# Patient Record
Sex: Male | Born: 1985 | Race: White | Hispanic: No | Marital: Single | Smoking: Current every day smoker
Health system: Southern US, Community
[De-identification: ages and names within clinical notes are randomized; demographics above are authoritative.]

## PROBLEM LIST (undated history)

## (undated) DIAGNOSIS — J45909 Unspecified asthma, uncomplicated: Secondary | ICD-10-CM

## (undated) DIAGNOSIS — R569 Unspecified convulsions: Secondary | ICD-10-CM

## (undated) DIAGNOSIS — F32A Depression, unspecified: Secondary | ICD-10-CM

## (undated) DIAGNOSIS — F419 Anxiety disorder, unspecified: Secondary | ICD-10-CM

## (undated) DIAGNOSIS — F329 Major depressive disorder, single episode, unspecified: Secondary | ICD-10-CM

## (undated) HISTORY — PX: APPENDECTOMY: SHX54

---
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
## 2006-01-23 ENCOUNTER — Emergency Department (HOSPITAL_COMMUNITY): Admission: EM | Admit: 2006-01-23 | Discharge: 2006-01-23 | Payer: Self-pay | Admitting: Emergency Medicine

## 2007-01-20 ENCOUNTER — Emergency Department (HOSPITAL_COMMUNITY): Admission: EM | Admit: 2007-01-20 | Discharge: 2007-01-20 | Payer: Self-pay | Admitting: Emergency Medicine

## 2007-01-21 ENCOUNTER — Emergency Department (HOSPITAL_COMMUNITY): Admission: EM | Admit: 2007-01-21 | Discharge: 2007-01-21 | Payer: Self-pay | Admitting: Emergency Medicine

## 2007-01-23 ENCOUNTER — Emergency Department (HOSPITAL_COMMUNITY): Admission: EM | Admit: 2007-01-23 | Discharge: 2007-01-24 | Payer: Self-pay | Admitting: Emergency Medicine

## 2007-03-30 ENCOUNTER — Emergency Department (HOSPITAL_COMMUNITY): Admission: EM | Admit: 2007-03-30 | Discharge: 2007-03-30 | Payer: Self-pay | Admitting: Emergency Medicine

## 2007-04-17 ENCOUNTER — Emergency Department (HOSPITAL_COMMUNITY): Admission: EM | Admit: 2007-04-17 | Discharge: 2007-04-17 | Payer: Self-pay | Admitting: Emergency Medicine

## 2007-04-25 ENCOUNTER — Emergency Department (HOSPITAL_COMMUNITY): Admission: EM | Admit: 2007-04-25 | Discharge: 2007-04-25 | Payer: Self-pay | Admitting: Emergency Medicine

## 2007-05-04 ENCOUNTER — Emergency Department (HOSPITAL_COMMUNITY): Admission: EM | Admit: 2007-05-04 | Discharge: 2007-05-04 | Payer: Self-pay | Admitting: *Deleted

## 2007-05-08 ENCOUNTER — Emergency Department (HOSPITAL_COMMUNITY): Admission: EM | Admit: 2007-05-08 | Discharge: 2007-05-08 | Payer: Self-pay | Admitting: Emergency Medicine

## 2007-06-29 ENCOUNTER — Emergency Department (HOSPITAL_COMMUNITY): Admission: EM | Admit: 2007-06-29 | Discharge: 2007-06-30 | Payer: Self-pay | Admitting: Emergency Medicine

## 2007-10-23 ENCOUNTER — Encounter (INDEPENDENT_AMBULATORY_CARE_PROVIDER_SITE_OTHER): Payer: Self-pay | Admitting: General Surgery

## 2007-10-23 ENCOUNTER — Ambulatory Visit (HOSPITAL_COMMUNITY): Admission: EM | Admit: 2007-10-23 | Discharge: 2007-10-24 | Payer: Self-pay | Admitting: Emergency Medicine

## 2008-07-29 ENCOUNTER — Emergency Department (HOSPITAL_COMMUNITY): Admission: EM | Admit: 2008-07-29 | Discharge: 2008-07-29 | Payer: Self-pay | Admitting: Emergency Medicine

## 2010-03-29 ENCOUNTER — Emergency Department (HOSPITAL_COMMUNITY): Admission: EM | Admit: 2010-03-29 | Discharge: 2010-03-30 | Payer: Self-pay | Admitting: Emergency Medicine

## 2011-04-05 NOTE — H&P (Signed)
NAME:  Zachary Morales, Zachary Morales NO.:  [id number]   MEDICAL RECORD NO.:  [id number]          PATIENT TYPE:  INP   LOCATION:  0098                         FACILITY:  [facility]   PHYSICIAN:  Zachary Morales, M.D.DATE OF BIRTH:  August 11, 1986   DATE OF ADMISSION:  10/23/2007  DATE OF DISCHARGE:                              HISTORY & PHYSICAL   CHIEF COMPLAINT:  Abdominal pain, approximately 8 to 10 hours duration.   HISTORY:  Zachary Morales is a 25 year old Caucasian male who presented to  the emergency room in the early a.m. after having the onset of pretty  quick abdominal pain last evening.  He stated that he had been well  yesterday, he works as a Paediatric nurse, and then after supper started having  pain that pretty quickly change from the upper abdomen to the right  lower quadrant.  He was nauseated and came to the ER approximately  midnight or approximately 2 a.m.  He states he had a bowel movement  previously yesterday that was unremarkable and no difficulty with  urination.  He was seen by the ER physician, Dr. Adriana Morales who found a white  count of 17,500.  He was definitely tender in the right lower quadrant  and a CT with oral and IV contrast was performed and this was read  approximately 0615 that showed acute appendicitis.  I was contacted and  fortunately there was OR available and will proceed on with an  appendectomy promptly.   PAST HISTORY:  He has had no previous surgeries.   SOCIAL HISTORY:  He is not married.  He works as a Paediatric nurse.   MEDICATIONS:  He is not on any type of chronic medications.  He had  received some Zofran in the emergency room.  He is on albuterol for a  breathing issue.   FAMILY HISTORY:  Noncontributory.   PHYSICAL EXAM:  VITAL SIGNS:  His temperature was 97.8, blood pressure  is 129/73, pulse originally was 114 and was 96 after pain medication,  respirations 16.  EYES, EARS, NOSE AND THROAT:  Unremarkable.  LUNGS:  Lungs are clear.  CARDIAC:   Normal sinus rhythm.  ABDOMEN:  He is definitely tender in the right lower quadrant.  He is  not tender in the upper abdomen.  RECTAL:  I did not do a rectal examination.  EXTREMITIES:  Unremarkable.  CNS: Physiologic.   CT was reviewed and showed an inflamed appendix in the right lower  quadrant with a little periappendiceal streaking.  We will plan on  proceeding on with a laparoscopic appendectomy and 3 grams of Unasyn is  ordered.          ______________________________  Zachary Pancoast. Zachary Morales, M.D.    WJW/MEDQ  D:  10/23/2007  T:  10/23/2007  Job:  [id number]

## 2011-04-05 NOTE — Op Note (Signed)
NAME:  Zachary Morales, Zachary Morales NO.:  [id number]   MEDICAL RECORD NO.:  [id number]          PATIENT TYPE:  INP   LOCATION:  0098                         FACILITY:  [facility]   PHYSICIAN:  Anselm Pancoast. Weatherly, M.D.DATE OF BIRTH:  [birth year]   DATE OF PROCEDURE:  DATE OF DISCHARGE:                               OPERATIVE REPORT   PREOPERATIVE DIAGNOSES:  Acute appendicitis.   POSTOPERATIVE DIAGNOSIS:  Acute appendicitis.   OPERATIONS:  Laparoscopic appendectomy.   ANESTHESIA:  General.   SURGEON:  Anselm Pancoast. [name], M.D.   ASSISTANT:  Scrub nurse.   HISTORY:  Elton Runkles is a 25 year old male who was fine yesterday, but  then yesterday evening started having abdominal pain that pretty quickly  changed to the right lower quadrant.  He went to bed, but he was having  increasing pain.  He presented to the emergency room at approximately  2:30.  White count 17,500.  A CT was performed which showed acutely  inflamed appendix in the right lower quadrant.  The patient agreed to a  laparoscopic appendectomy.  Unfortunately there was OR space available  at 7 o'clock, so we could proceed on before the OR schedule.   DESCRIPTION OF PROCEDURE:  The patient was induced general anesthesia.  He had received his Unasyn and positioned on the OR table.  An  endotracheal tube and oral tube to the stomach by Dr. Shireen Quan, and then  the abdomen had been clipped around the umbilicus with a 10-11 port in  the left lower quadrant and prepped with Betadine solution.  A Foley  catheter had been inserted sterilely.  A small incision was made below  the umbilicus __________ quite muscular.  The fascia was identified,  picked up between two Kochers and then very carefully opened through the  fascia into the preperitoneal space.  The underlying peritoneum could be  identified, and I carefully went through this with a Tresa Endo.  A  pursestring suture of 0 Vicryl was placed.  This Hassan cannula  introduced and then a camera introduced.  You could see there was an  inflammatory process in the right lower quadrant.  A 5 mm port was  placed to the right upper quadrant and then the 10-11 was placed in the  left lower quadrant under direct vision.  With the patient in a pretty  steep Trendelenburg rotated to the left, I could slide the omentum up  and then could visualize the appendix, which I grasped it with a million  dollar grasper.  We then tried to decide whether it would be best with a  harmonic scalpel through the 5 mm or possibly umbilicus, but I elected  to hold the appendix up with a 5 mm port and then divided the mesentery  with the Monarch scalpel right to the base of the appendix.  I then used  the GIA stapler laparoscopically with the vascular staples through the  umbilical port, placing it across the base of the appendix.  The cecum  was in good position.  I fired it and there was good hemostasis.  The  appendix was then placed in an EndoCatch bag and then brought out and  the [facility] cannula reinserted.  We inspected, did a little irrigating.  There was no evidence any bleeding.  The 10-11 port under direct vision  was removed, and then the upper 5 mm port was removed under direct  vision.  I then released the carbon dioxide and __________ the fascia  with Kochers.  I put additional figure-of-eight of 0 Vicryl and tied  both it and the pursestring.  I had anesthetized the fascia at the  umbilicus and at the 10-11 in the left lower quadrant with Marcaine.  I  then put a figure-of-eight in the anterior fascia of the left lower  quadrant.  The subcutaneous wounds were closed with 4-0 Vicryl.  Benzoin  and Steri-Strips on the skin.  The patient tolerated the procedure  nicely.  He possibly will be able to go home this evening.  We will see  how he is doing today before making that decision.  I hope they can  start on a liquid at lunchtime and use Vicodin for pain.  I am going  to  give him one additional dose of antibiotics, but the appendix was not  ruptured and I do not think any spillage at the appendectomy.           ______________________________  Anselm Pancoast. [name], M.D.     WJW/MEDQ  D:  10/23/2007  T:  10/23/2007  Job:  [id number]

## 2011-08-29 LAB — COMPREHENSIVE METABOLIC PANEL
AST: 33
Albumin: 4.3
Alkaline Phosphatase: 77
GFR calc Af Amer: >60

## 2011-08-29 LAB — CBC
HCT: 39.3
Hemoglobin: 14
MCHC: 35.5
RBC: 4.43
RDW: 12.7

## 2011-08-29 LAB — DIFFERENTIAL
Basophils Absolute: 0
Eosinophils Relative: 1
Lymphs Abs: 1
Monocytes Absolute: 1.1 — ABNORMAL HIGH
Neutrophils Relative %: 87 — ABNORMAL HIGH

## 2011-08-29 LAB — LIPASE, BLOOD: Lipase: 23

## 2011-08-29 LAB — URINALYSIS, ROUTINE W REFLEX MICROSCOPIC
Bilirubin Urine: NEGATIVE
Ketones, ur: NEGATIVE
Nitrite: NEGATIVE
Urobilinogen, UA: 0.2
pH: 6.5

## 2012-11-28 ENCOUNTER — Emergency Department (HOSPITAL_COMMUNITY): Payer: Self-pay

## 2012-11-28 ENCOUNTER — Encounter (HOSPITAL_COMMUNITY): Payer: Self-pay | Admitting: Emergency Medicine

## 2012-11-28 ENCOUNTER — Emergency Department (HOSPITAL_COMMUNITY)
Admission: EM | Admit: 2012-11-28 | Discharge: 2012-11-28 | Disposition: A | Discharge status: Home / Self Care | Payer: Self-pay | Attending: Emergency Medicine | Admitting: Emergency Medicine

## 2012-11-28 DIAGNOSIS — J0140 Acute pansinusitis, unspecified: Secondary | ICD-10-CM

## 2012-11-28 DIAGNOSIS — H53149 Visual discomfort, unspecified: Secondary | ICD-10-CM | POA: Insufficient documentation

## 2012-11-28 DIAGNOSIS — R112 Nausea with vomiting, unspecified: Secondary | ICD-10-CM | POA: Insufficient documentation

## 2012-11-28 DIAGNOSIS — J45909 Unspecified asthma, uncomplicated: Secondary | ICD-10-CM | POA: Insufficient documentation

## 2012-11-28 DIAGNOSIS — G43909 Migraine, unspecified, not intractable, without status migrainosus: Secondary | ICD-10-CM | POA: Insufficient documentation

## 2012-11-28 DIAGNOSIS — Z79899 Other long term (current) drug therapy: Secondary | ICD-10-CM | POA: Insufficient documentation

## 2012-11-28 DIAGNOSIS — J018 Other acute sinusitis: Secondary | ICD-10-CM | POA: Insufficient documentation

## 2012-11-28 HISTORY — DX: Unspecified asthma, uncomplicated: J45.909

## 2012-11-28 MED ORDER — KETOROLAC TROMETHAMINE 30 MG/ML IJ SOLN
30.0000 mg | Freq: Once | INTRAMUSCULAR | Status: AC
  Administered 2012-11-28: 30 mg via INTRAVENOUS
  Filled 2012-11-28: qty 1

## 2012-11-28 MED ORDER — ONDANSETRON HCL 4 MG/2ML IJ SOLN
4.0000 mg | Freq: Once | INTRAMUSCULAR | Status: AC
  Administered 2012-11-28: 4 mg via INTRAVENOUS
  Filled 2012-11-28: qty 2

## 2012-11-28 MED ORDER — HYDROCODONE-ACETAMINOPHEN 5-325 MG PO TABS: 2.0000 | ORAL_TABLET | ORAL | Status: DC | PRN

## 2012-11-28 MED ORDER — ALBUTEROL SULFATE HFA 108 (90 BASE) MCG/ACT IN AERS
2.0000 | INHALATION_SPRAY | RESPIRATORY_TRACT | Status: DC | PRN
  Administered 2012-11-28: 2 via RESPIRATORY_TRACT
  Filled 2012-11-28: qty 6.7

## 2012-11-28 MED ORDER — DOXYCYCLINE HYCLATE 100 MG PO CAPS: 100.0000 mg | ORAL_CAPSULE | Freq: Two times a day (BID) | ORAL | Status: DC

## 2012-11-28 MED ORDER — DEXTROSE 5 % IV SOLN
1.0000 g | Freq: Once | INTRAVENOUS | Status: AC
  Administered 2012-11-28: 1 g via INTRAVENOUS
  Filled 2012-11-28: qty 10

## 2012-11-28 MED ORDER — SUMATRIPTAN SUCCINATE 6 MG/0.5ML ~~LOC~~ SOLN
6.0000 mg | Freq: Once | SUBCUTANEOUS | Status: AC
  Administered 2012-11-28: 6 mg via SUBCUTANEOUS
  Filled 2012-11-28: qty 0.5

## 2012-11-28 MED ORDER — SODIUM CHLORIDE 0.9 % IV BOLUS (SEPSIS)
1000.0000 mL | Freq: Once | INTRAVENOUS | Status: AC
  Administered 2012-11-28: 1000 mL via INTRAVENOUS

## 2012-11-28 MED ORDER — PSEUDOEPHEDRINE HCL 30 MG/5ML PO SYRP: 30.0000 mg | ORAL_SOLUTION | Freq: Four times a day (QID) | ORAL | Status: DC | PRN

## 2012-11-28 MED ORDER — DIPHENHYDRAMINE HCL 50 MG/ML IJ SOLN
25.0000 mg | Freq: Once | INTRAMUSCULAR | Status: AC
  Administered 2012-11-28: 25 mg via INTRAVENOUS
  Filled 2012-11-28: qty 1

## 2012-11-28 MED ORDER — AMOXICILLIN-POT CLAVULANATE 500-125 MG PO TABS: 1.0000 | ORAL_TABLET | Freq: Three times a day (TID) | ORAL | Status: DC

## 2012-11-28 MED ORDER — FENTANYL CITRATE 0.05 MG/ML IJ SOLN
INTRAMUSCULAR | Status: AC
  Administered 2012-11-28: 50 ug
  Filled 2012-11-28: qty 2

## 2012-11-28 MED ORDER — FENTANYL CITRATE 0.05 MG/ML IJ SOLN
100.0000 ug | Freq: Once | INTRAMUSCULAR | Status: AC
  Administered 2012-11-28: 50 ug via INTRAVENOUS
  Filled 2012-11-28: qty 2

## 2012-11-28 NOTE — ED Notes (Signed)
Family gave pt 2 puffs of albuterol from home inhaler.

## 2012-11-28 NOTE — ED Notes (Signed)
Pt given crackers and ginger ale.  Pt states he feels better.  No sob

## 2012-11-28 NOTE — ED Provider Notes (Signed)
History     CSN: [id number]  Arrival date & time 11/28/12  4098   First MD Initiated Contact with Patient 11/28/12 0740     Chief Complaint  Patient presents with  . Cough  . Headache  . Influenza     HPI  Patient has had productive cough for the last week and then this morning woke up with a migraine headache with photophobia nausea and vomiting.  Patient has history of migraines in the past and states that this feels more like a migraine and not a typical headache.  Patient denies stiff neck.  Has history of asthma uses albuterol on a when necessary basis.  He last used on the raw last night. Past Medical History  Diagnosis Date  . Asthma     Past Surgical History  Procedure Date  . Appendectomy     No family history on file.  History  Substance Use Topics  . Smoking status: Never Smoker   . Smokeless tobacco: Not on file  . Alcohol Use: No     Comment: occassional      Review of Systems All other systems reviewed and are negative Allergies  Review of patient's allergies indicates no known allergies.  Home Medications   Current Outpatient Rx  Name  Route  Sig  Dispense  Refill  . ALBUTEROL SULFATE HFA 108 (90 BASE) MCG/ACT IN AERS   Inhalation   Inhale 2 puffs into the lungs every 6 (six) hours as needed. Wheezing         . AMOXICILLIN-POT CLAVULANATE 500-125 MG PO TABS   Oral   Take 1 tablet (500 mg total) by mouth 3 (three) times daily.   30 tablet   0   . HYDROCODONE-ACETAMINOPHEN 5-325 MG PO TABS   Oral   Take 2 tablets by mouth every 4 (four) hours as needed for pain.   10 tablet   0   . PSEUDOEPHEDRINE HCL 30 MG/5ML PO SYRP   Oral   Take 5 mLs (30 mg total) by mouth 4 (four) times daily as needed.   118 mL   0     BP 145/96  Pulse 119  Temp 97.7 F (36.5 C) (Oral)  Resp 16  Ht 5\' 8"  (1.727 m)  Wt 160 lb (72.576 kg)  BMI 24.33 kg/m2  SpO2 99%  Physical Exam  Nursing note and vitals reviewed. Constitutional: He is oriented to  person, place, and time. He appears well-developed and well-nourished. He appears distressed (Patient has photophobia and appears uncomfortable from his headache).  HENT:  Head: Normocephalic and atraumatic.         Tender to percussion where indicated  Eyes: Pupils are equal, round, and reactive to light.  Neck: Normal range of motion.  Cardiovascular: Normal rate and intact distal pulses.   Pulmonary/Chest: No respiratory distress.  Abdominal: Normal appearance. He exhibits no distension.  Musculoskeletal: Normal range of motion.  Neurological: He is alert and oriented to person, place, and time. No cranial nerve deficit. GCS eye subscore is 4. GCS verbal subscore is 5. GCS motor subscore is 6.  Skin: Skin is warm and dry. No rash noted.  Psychiatric: He has a normal mood and affect. His behavior is normal.    ED Course  Procedures (including critical care time)  Medications  albuterol (PROVENTIL HFA;VENTOLIN HFA) 108 (90 BASE) MCG/ACT inhaler (not administered)  pseudoephedrine (SUDAFED) 30 MG/5ML syrup (not administered)  amoxicillin-clavulanate (AUGMENTIN) 500-125 MG per tablet (not administered)  HYDROcodone-acetaminophen (  NORCO/VICODIN) 5-325 MG per tablet (not administered)  doxycycline (VIBRAMYCIN) 100 MG capsule (not administered)  albuterol (PROVENTIL HFA;VENTOLIN HFA) 108 (90 BASE) MCG/ACT inhaler 2 puff (2 puff Inhalation Given 11/28/12 1028)  sodium chloride 0.9 % bolus 1,000 mL (0 mL Intravenous Stopped 11/28/12 0917)  ketorolac (TORADOL) 30 MG/ML injection 30 mg (30 mg Intravenous Given 11/28/12 0812)  ondansetron (ZOFRAN) injection 4 mg (4 mg Intravenous Given 11/28/12 0812)  fentaNYL (SUBLIMAZE) injection 100 mcg (50 mcg Intravenous Given 11/28/12 0812)  SUMAtriptan (IMITREX) injection 6 mg (6 mg Subcutaneous Given 11/28/12 0813)  fentaNYL (SUBLIMAZE) 0.05 MG/ML injection (50 mcg  Given 11/28/12 0823)  cefTRIAXone (ROCEPHIN) 1 g in dextrose 5 % 50 mL IVPB (0 g Intravenous Stopped  11/28/12 1008)  diphenhydrAMINE (BENADRYL) injection 25 mg (25 mg Intravenous Given 11/28/12 0916)    Labs Reviewed - No data to display Dg Chest 2 View  11/28/2012  *RADIOLOGY REPORT*  Clinical Data: Fever, cough.  CHEST - 2 VIEW  Comparison: Mar 29, 2010.  Findings: Cardiomediastinal silhouette appears normal.  No acute pulmonary disease is noted.  Bony thorax is intact.  IMPRESSION: No acute cardiopulmonary abnormality seen.   Original Report Authenticated By: Lupita Raider.,  M.D.    Ct Head Wo Contrast  11/28/2012  *RADIOLOGY REPORT*  Clinical Data:  Migraine headaches, photophobia, nausea vomiting, cough, congestion  CT HEAD WITHOUT CONTRAST  Technique:  Contiguous axial images were obtained from the base of the skull through the vertex without contrast  Comparison:  03/29/2010  Findings:  The brain has a normal appearance without evidence for hemorrhage, acute infarction, hydrocephalus, or mass lesion.  There is no extra axial fluid collection.   The mastoids are clear. Sphenoid, ethmoid, and maxillary sinuses demonstrate mucosal thickening bilaterally.  Right maxillary sinus air fluid level noted.  Findings compatible with pansinusitis.  IMPRESSION: Normal CT of the head without contrast.  Pan sinusitis as above.   Original Report Authenticated By: Judie Petit. Shick, M.D.      1. Acute pansinusitis       MDM  After treatment in the ED the patient feels back to baseline and wants to go home.        Nelia Shi, MD 11/28/12 1106

## 2012-11-28 NOTE — ED Notes (Signed)
States that he has been sick for 1 week. States that he has chills, cough, congestion, and decreased appetite. Productive cough with green sputum

## 2013-03-01 ENCOUNTER — Encounter (HOSPITAL_COMMUNITY): Payer: Self-pay | Admitting: Emergency Medicine

## 2013-03-01 ENCOUNTER — Emergency Department (HOSPITAL_COMMUNITY)
Admission: EM | Admit: 2013-03-01 | Discharge: 2013-03-02 | Disposition: A | Discharge status: Home / Self Care | Payer: Self-pay | Attending: Emergency Medicine | Admitting: Emergency Medicine

## 2013-03-01 DIAGNOSIS — Z79899 Other long term (current) drug therapy: Secondary | ICD-10-CM | POA: Insufficient documentation

## 2013-03-01 DIAGNOSIS — J45909 Unspecified asthma, uncomplicated: Secondary | ICD-10-CM | POA: Insufficient documentation

## 2013-03-01 DIAGNOSIS — E876 Hypokalemia: Secondary | ICD-10-CM | POA: Insufficient documentation

## 2013-03-01 DIAGNOSIS — R45851 Suicidal ideations: Secondary | ICD-10-CM | POA: Insufficient documentation

## 2013-03-01 DIAGNOSIS — F329 Major depressive disorder, single episode, unspecified: Secondary | ICD-10-CM | POA: Insufficient documentation

## 2013-03-01 DIAGNOSIS — F410 Panic disorder [episodic paroxysmal anxiety] without agoraphobia: Secondary | ICD-10-CM | POA: Insufficient documentation

## 2013-03-01 DIAGNOSIS — F4320 Adjustment disorder, unspecified: Secondary | ICD-10-CM | POA: Insufficient documentation

## 2013-03-01 DIAGNOSIS — F3289 Other specified depressive episodes: Secondary | ICD-10-CM | POA: Insufficient documentation

## 2013-03-01 MED ORDER — SODIUM CHLORIDE 0.9 % IV SOLN
INTRAVENOUS | Status: DC
  Administered 2013-03-02: via INTRAVENOUS

## 2013-03-01 MED ORDER — LORAZEPAM 2 MG/ML IJ SOLN
2.0000 mg | Freq: Once | INTRAMUSCULAR | Status: AC
  Administered 2013-03-02: 2 mg via INTRAVENOUS
  Filled 2013-03-01: qty 1

## 2013-03-01 NOTE — ED Notes (Signed)
Pt states he has been depressed x 1 week, +SI, pt states there is an issue with his wife making him feel this way.

## 2013-03-01 NOTE — ED Provider Notes (Signed)
History     CSN: [id number]  Arrival date & time 03/01/13  2346   First MD Initiated Contact with Patient 03/01/13 2355      Chief Complaint  Patient presents with  . Chest Pain    (Consider location/radiation/quality/duration/timing/severity/associated sxs/prior treatment) HPI Level 5 Caveat: altered mental status. This is a 27 year old male who was found on the ground thrashing about by a neighbor who is well familiar with the patient. The neighbor drove the patient to the ED. He states the patient had several "seizures" all the way during which she was thrashing about and screaming. On arrival he is thrashing about and screaming stating that his chest hurts, it is burning, he hurts all over. He is screaming obscenities and being uncooperative. He initially denied drug use on the grounds that he is Jewish but subsequently admitted to smoking marijuana; when asked if if the marijuana may have been treated with some other drugs he stated "I rolled it myself".  On further questioning the patient admits to being depressed and suicidal the past week after his wife left him and took their children.  Past Medical History  Diagnosis Date  . Asthma     Past Surgical History  Procedure Laterality Date  . Appendectomy      No family history on file.  History  Substance Use Topics  . Smoking status: Never Smoker   . Smokeless tobacco: Not on file  . Alcohol Use: Yes     Comment: occassional      Review of Systems  Unable to perform ROS   Allergies  Review of patient's allergies indicates no known allergies.  Home Medications   Current Outpatient Rx  Name  Route  Sig  Dispense  Refill  . albuterol (PROVENTIL HFA;VENTOLIN HFA) 108 (90 BASE) MCG/ACT inhaler   Inhalation   Inhale 2 puffs into the lungs every 6 (six) hours as needed. Wheezing         . diazepam (VALIUM) 2 MG tablet   Oral   Take 0.5-1 mg by mouth once.           BP 159/79  Pulse 120  Temp(Src)  98.3 F (36.8 C)  Resp 30  Ht 5\' 7"  (1.702 m)  Wt 165 lb (74.844 kg)  BMI 25.84 kg/m2  SpO2 98%  Physical Exam General: Well-developed, well-nourished male in obvious emotional distress, thrashing and yelling obscenities; appearance consistent with age of record HENT: normocephalic, atraumatic Eyes: pupils equal round and reactive to light; extraocular muscles intact Neck: supple Heart: regular rate and rhythm; tachycardia Lungs: clear to auscultation bilaterally; tachypnea Abdomen: soft; nondistended; nontender; no masses or hepatosplenomegaly; bowel sounds present Extremities: No deformity; full range of motion; pulses normal Neurologic: Awake, alert; motor function intact in all extremities and symmetric; no facial droop Skin: Warm and dry Psychiatric: Agitated; angry affect; depressed; suicidal ideation; denies homicidal ideation    ED Course  Procedures (including critical care time)     MDM   Nursing notes and vitals signs, including pulse oximetry, reviewed.  Summary of this visit's results, reviewed by myself:  Labs:  Results for orders placed during the hospital encounter of 03/01/13 (from the past 24 hour(s))  CBC WITH DIFFERENTIAL     Status: Abnormal   Collection Time    03/01/13 11:57 PM      Result Value Range   WBC 12.9 (*) 4.0 - 10.5 K/uL   RBC 5.20  4.22 - 5.81 MIL/uL   Hemoglobin 16.1  13.0 - 17.0 g/dL   HCT 69.6  29.5 - 28.4 %   MCV 86.9  78.0 - 100.0 fL   MCH 31.0  26.0 - 34.0 pg   MCHC 35.6  30.0 - 36.0 g/dL   RDW 13.2  44.0 - 10.2 %   Platelets 300  150 - 400 K/uL   Neutrophils Relative 65  43 - 77 %   Neutro Abs 8.4 (*) 1.7 - 7.7 K/uL   Lymphocytes Relative 26  12 - 46 %   Lymphs Abs 3.3  0.7 - 4.0 K/uL   Monocytes Relative 7  3 - 12 %   Monocytes Absolute 1.0  0.1 - 1.0 K/uL   Eosinophils Relative 2  0 - 5 %   Eosinophils Absolute 0.2  0.0 - 0.7 K/uL   Basophils Relative 0  0 - 1 %   Basophils Absolute 0.1  0.0 - 0.1 K/uL  CK TOTAL  AND CKMB     Status: Abnormal   Collection Time    03/01/13 11:57 PM      Result Value Range   Total CK 240 (*) 7 - 232 U/L   CK, MB 2.0  0.3 - 4.0 ng/mL   Relative Index 0.8  0.0 - 2.5  ETHANOL     Status: None   Collection Time    03/01/13 11:57 PM      Result Value Range   Alcohol, Ethyl (B) <11  0 - 11 mg/dL  SALICYLATE LEVEL     Status: Abnormal   Collection Time    03/01/13 11:57 PM      Result Value Range   Salicylate Lvl <2.0 (*) 2.8 - 20.0 mg/dL  ACETAMINOPHEN LEVEL     Status: None   Collection Time    03/01/13 11:57 PM      Result Value Range   Acetaminophen (Tylenol), Serum <15.0  10 - 30 ug/mL  POCT I-STAT TROPONIN I     Status: None   Collection Time    03/02/13 12:00 AM      Result Value Range   Troponin i, poc 0.00  0.00 - 0.08 ng/mL   Comment 3           POCT I-STAT, CHEM 8     Status: Abnormal   Collection Time    03/02/13 12:02 AM      Result Value Range   Sodium 142  135 - 145 mEq/L   Potassium 3.0 (*) 3.5 - 5.1 mEq/L   Chloride 107  96 - 112 mEq/L   BUN 9  6 - 23 mg/dL   Creatinine, Ser 7.25  0.50 - 1.35 mg/dL   Glucose, Bld 366 (*) 70 - 99 mg/dL   Calcium, Ion 4.40  3.47 - 1.23 mmol/L   TCO2 16  0 - 100 mmol/L   Hemoglobin 16.7  13.0 - 17.0 g/dL   HCT 42.5  95.6 - 38.7 %  BLOOD GAS, VENOUS     Status: Abnormal   Collection Time    03/02/13 12:25 AM      Result Value Range   FIO2 0.21     pH, Ven 7.536 (*) 7.250 - 7.300   pCO2, Ven 23.6 (*) 45.0 - 50.0 mmHg   pO2, Ven 86.2 (*) 30.0 - 45.0 mmHg   Bicarbonate 19.9 (*) 20.0 - 24.0 mEq/L   TCO2 16.6  0 - 100 mmol/L   Acid-base deficit 0.5  0.0 - 2.0 mmol/L   O2 Saturation 97.6  Patient temperature 98.6     Drawn by COLLECTED BY LABORATORY     Sample type VENOUS    URINE RAPID DRUG SCREEN (HOSP PERFORMED)     Status: Abnormal   Collection Time    03/02/13  1:18 AM      Result Value Range   Opiates NONE DETECTED  NONE DETECTED   Cocaine NONE DETECTED  NONE DETECTED   Benzodiazepines  POSITIVE (*) NONE DETECTED   Amphetamines NONE DETECTED  NONE DETECTED   Tetrahydrocannabinol POSITIVE (*) NONE DETECTED   Barbiturates NONE DETECTED  NONE DETECTED    EKG Interpretation:  Date & Time: 03/02/2013 12:07 AM  Rate: 143  Rhythm: sinus tachycardia  QRS Axis: right  Intervals: normal  ST/T Wave abnormalities: normal  Conduction Disutrbances:none  Narrative Interpretation:   Old EKG Reviewed: none available  12:28 AM Patient much more calm and apologetic after being given 2 mg of Ativan IV. He states that during the past week he has had to undergo separation from his wife and children and he has had a poor ability to cope with this. He states he has been having "seizures" which he states consist of pain in his chest and rapid breathing. He states he sometimes doesn't remember what happens during these episodes. He has no history of true seizures.   He admits to suicidal thoughts and is considered driving a motorcycle off a cliff but states that he is basically too "chicken" to actually carry it out.  12:55 AM The patient's blood gas is consistent with hyperventilation. His "seizures" are likely panic attacks with syncope as a result of hyperventilation.      Hanley Seamen, MD 03/02/13 [phone]

## 2013-03-01 NOTE — ED Notes (Signed)
Pt states that "my wife has left and taken my baby today."  Pt directed to breath deeply to be able to stop his hyperventilating.  Pt has a family friend at bedside

## 2013-03-01 NOTE — ED Notes (Signed)
Pt brought to ED by friend, pt c/o severe chest pain, pt appears to have total body contractions. Pt yelling out during these episodes.

## 2013-03-02 LAB — BLOOD GAS, VENOUS
Bicarbonate: 19.9 mEq/L — ABNORMAL LOW (ref 20.0–24.0)
O2 Saturation: 97.6 %
Patient temperature: 98.6
TCO2: 16.6 mmol/L (ref 0–100)
pO2, Ven: 86.2 mmHg — ABNORMAL HIGH (ref 30.0–45.0)

## 2013-03-02 LAB — CBC WITH DIFFERENTIAL/PLATELET
Basophils Absolute: 0.1 10*3/uL (ref 0.0–0.1)
Basophils Relative: 0 % (ref 0–1)
Eosinophils Absolute: 0.2 10*3/uL (ref 0.0–0.7)
Eosinophils Relative: 2 % (ref 0–5)
MCH: 31 pg (ref 26.0–34.0)
MCHC: 35.6 g/dL (ref 30.0–36.0)
MCV: 86.9 fL (ref 78.0–100.0)
Monocytes Absolute: 1 10*3/uL (ref 0.1–1.0)
Platelets: 300 10*3/uL (ref 150–400)
RDW: 12.7 % (ref 11.5–15.5)
WBC: 12.9 10*3/uL — ABNORMAL HIGH (ref 4.0–10.5)

## 2013-03-02 LAB — POCT I-STAT, CHEM 8
BUN: 9 mg/dL (ref 6–23)
Creatinine, Ser: 1.2 mg/dL (ref 0.50–1.35)
Glucose, Bld: 130 mg/dL — ABNORMAL HIGH (ref 70–99)
Hemoglobin: 16.7 g/dL (ref 13.0–17.0)
Potassium: 3 mEq/L — ABNORMAL LOW (ref 3.5–5.1)
TCO2: 16 mmol/L (ref 0–100)

## 2013-03-02 LAB — CK TOTAL AND CKMB (NOT AT ARMC): Total CK: 240 U/L — ABNORMAL HIGH (ref 7–232)

## 2013-03-02 LAB — RAPID URINE DRUG SCREEN, HOSP PERFORMED
Amphetamines: NOT DETECTED
Benzodiazepines: POSITIVE — AB
Tetrahydrocannabinol: POSITIVE — AB

## 2013-03-02 LAB — ACETAMINOPHEN LEVEL: Acetaminophen (Tylenol), Serum: <15 ug/mL (ref 10–30)

## 2013-03-02 LAB — SALICYLATE LEVEL: Salicylate Lvl: <2 mg/dL — ABNORMAL LOW (ref 2.8–20.0)

## 2013-03-02 MED ORDER — CLONAZEPAM 0.5 MG PO TABS: 0.5000 mg | ORAL_TABLET | Freq: Every day | ORAL | Status: DC

## 2013-03-02 MED ORDER — ZIPRASIDONE MESYLATE 20 MG IM SOLR
INTRAMUSCULAR | Status: AC
  Administered 2013-03-02: 20 mg
  Filled 2013-03-02: qty 20

## 2013-03-02 MED ORDER — LORAZEPAM 1 MG PO TABS
2.0000 mg | ORAL_TABLET | Freq: Four times a day (QID) | ORAL | Status: DC | PRN
  Administered 2013-03-02: 2 mg via ORAL
  Filled 2013-03-02: qty 2

## 2013-03-02 MED ORDER — ONDANSETRON HCL 4 MG PO TABS: 4.0000 mg | ORAL_TABLET | Freq: Three times a day (TID) | ORAL | Status: DC | PRN

## 2013-03-02 MED ORDER — POTASSIUM CHLORIDE CRYS ER 20 MEQ PO TBCR
20.0000 meq | EXTENDED_RELEASE_TABLET | Freq: Every day | ORAL | Status: DC
  Administered 2013-03-02: 20 meq via ORAL
  Filled 2013-03-02: qty 1

## 2013-03-02 MED ORDER — POTASSIUM CHLORIDE 20 MEQ/15ML (10%) PO LIQD
20.0000 meq | Freq: Once | ORAL | Status: AC
  Administered 2013-03-02: 20 meq via ORAL
  Filled 2013-03-02: qty 15

## 2013-03-02 MED ORDER — ACETAMINOPHEN 325 MG PO TABS: 650.0000 mg | ORAL_TABLET | ORAL | Status: DC | PRN

## 2013-03-02 MED ORDER — ALUM & MAG HYDROXIDE-SIMETH 200-200-20 MG/5ML PO SUSP: 30.0000 mL | ORAL | Status: DC | PRN

## 2013-03-02 NOTE — ED Notes (Signed)
Called SOC to inform patient is awake and ready for consult.

## 2013-03-02 NOTE — ED Notes (Addendum)
Pt transferred from main ED presents with complaint of depression & anxiety after wife & child left him tonight.  C/O depression x 2 wks.  SI with plan to run car off bridge.  Feeling hopeless at present.
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
## 2013-03-02 NOTE — ED Notes (Signed)
At nurses station loud, angry wanting to use the phone-stating that he has been waiting for something for pain. Explained that his previous nurse had spoken with the MD and he was not going to order any narcotic pain med. Offered the tylenol or ativan. Stated he had been waiting for the ativan and no one ever brought it. Told him if he would wait, I would get it for him right now. After I went into the med room, he started arguing with Crawford(security officer). Began to really escalate-stating he would fight Crawford,yelling, cursing. Additional security and [employer] called to assist. They escorted him back to his room. I went in to give him the ativan and he refused(continuing to argue with [employer]). As I was walking back down the hall he said he did want the ativan. Back in his room he did take med-the whole time arguing with [employer]. Very hyperverbal, flight of ideas.

## 2013-03-02 NOTE — ED Notes (Signed)
telepsych consult in progress

## 2013-03-02 NOTE — B[facility] Counselor (Signed)
Telepsych consult attempted by Dr. Jacky Kindle.  Pt given Geodon prior to psychiatrist calling, unable to complete telepsych at this time.  Will try again later today.

## 2013-03-02 NOTE — ED Notes (Signed)
Pt's wife called security asking for information concerning patient.  Pt asked if information could be given, pt states NOT to divulge any information to outside callers.

## 2013-03-02 NOTE — ED Provider Notes (Signed)
4:46 PM pt has had telepsych consult who recommend discharge.  He has rescinded the IVC paperwork.  Recommend klonopin 0.5mg  po qHS x 10 days which I will write.    Ethelda Chick, MD 03/02/13 [phone]

## 2013-03-02 NOTE — ED Notes (Signed)
Awake from Geodon injection. Up to nurses station very apologetic about his behavior this morning. Pleasant, cooperative.

## 2013-04-29 ENCOUNTER — Emergency Department (HOSPITAL_COMMUNITY)
Admission: EM | Admit: 2013-04-29 | Discharge: 2013-04-29 | Disposition: A | Discharge status: Home / Self Care | Payer: Self-pay | Attending: Emergency Medicine | Admitting: Emergency Medicine

## 2013-04-29 ENCOUNTER — Encounter (HOSPITAL_COMMUNITY): Payer: Self-pay

## 2013-04-29 DIAGNOSIS — J45909 Unspecified asthma, uncomplicated: Secondary | ICD-10-CM | POA: Insufficient documentation

## 2013-04-29 DIAGNOSIS — R569 Unspecified convulsions: Secondary | ICD-10-CM | POA: Insufficient documentation

## 2013-04-29 DIAGNOSIS — R51 Headache: Secondary | ICD-10-CM | POA: Insufficient documentation

## 2013-04-29 DIAGNOSIS — Z79899 Other long term (current) drug therapy: Secondary | ICD-10-CM | POA: Insufficient documentation

## 2013-04-29 LAB — COMPREHENSIVE METABOLIC PANEL
ALT: 30 U/L (ref 0–53)
AST: 23 U/L (ref 0–37)
Calcium: 8.6 mg/dL (ref 8.4–10.5)
Creatinine, Ser: 0.88 mg/dL (ref 0.50–1.35)
GFR calc Af Amer: >90 mL/min (ref >90)
Sodium: 139 mEq/L (ref 135–145)
Total Protein: 6.4 g/dL (ref 6.0–8.3)

## 2013-04-29 LAB — ETHANOL: Alcohol, Ethyl (B): <11 mg/dL (ref 0–11)

## 2013-04-29 LAB — RAPID URINE DRUG SCREEN, HOSP PERFORMED
Amphetamines: NOT DETECTED
Barbiturates: NOT DETECTED
Benzodiazepines: POSITIVE — AB
Cocaine: NOT DETECTED

## 2013-04-29 LAB — CBC WITH DIFFERENTIAL/PLATELET
Basophils Absolute: 0 10*3/uL (ref 0.0–0.1)
Eosinophils Absolute: 0.4 10*3/uL (ref 0.0–0.7)
Eosinophils Relative: 5 % (ref 0–5)
MCH: 30.5 pg (ref 26.0–34.0)
MCHC: 35 g/dL (ref 30.0–36.0)
MCV: 87.1 fL (ref 78.0–100.0)
Platelets: 236 10*3/uL (ref 150–400)
RDW: 12.7 % (ref 11.5–15.5)

## 2013-04-29 MED ORDER — LORAZEPAM 2 MG/ML IJ SOLN
1.0000 mg | Freq: Once | INTRAMUSCULAR | Status: AC
  Administered 2013-04-29: 1 mg via INTRAVENOUS
  Filled 2013-04-29: qty 1

## 2013-04-29 MED ORDER — POTASSIUM CHLORIDE CRYS ER 20 MEQ PO TBCR
40.0000 meq | EXTENDED_RELEASE_TABLET | Freq: Once | ORAL | Status: AC
  Administered 2013-04-29: 40 meq via ORAL
  Filled 2013-04-29: qty 2

## 2013-04-29 NOTE — ED Provider Notes (Signed)
History     CSN: [id number]  Arrival date & time 04/29/13  0123   First MD Initiated Contact with Patient 04/29/13 0138      Chief Complaint  Patient presents with  . Seizures   HPI  History provided by the patient and previous medical record. Patient is a 27 year old male with history of asthma who presents with possible seizure by EMS. Patient does not recall exactly what happened. He states he was out to eat does admit to having one beer with dinner and after returning home he remembers everyone standing around him and EMS taking him to the hospital. He states he was told he may have had a seizure. Patient states he has had some seizures in the past recently but has not been evaluated for this because he does not have insurance. He has no other past history of seizures. Patient was seen in the emergency room last April without any reports a past history of seizures. He was seen at that time for psychiatric issues. Patient denies any SI or H. He does report slight headache. He denies any other complaints or associated symptoms. There was no urinary or fecal incontinence. No bite marks to the tongue.     Past Medical History  Diagnosis Date  . Asthma     Past Surgical History  Procedure Laterality Date  . Appendectomy      History reviewed. No pertinent family history.  History  Substance Use Topics  . Smoking status: Never Smoker   . Smokeless tobacco: Not on file  . Alcohol Use: Yes     Comment: occassional      Review of Systems  Respiratory: Negative for shortness of breath.   Cardiovascular: Negative for palpitations.  Neurological: Positive for seizures and headaches. Negative for weakness and numbness.  All other systems reviewed and are negative.    Allergies  Review of patient's allergies indicates no known allergies.  Home Medications   Current Outpatient Rx  Name  Route  Sig  Dispense  Refill  . albuterol (PROVENTIL HFA;VENTOLIN HFA) 108 (90 BASE)  MCG/ACT inhaler   Inhalation   Inhale 2 puffs into the lungs every 6 (six) hours as needed. Wheezing         . clonazePAM (KLONOPIN) 0.5 MG tablet   Oral   Take 1 tablet (0.5 mg total) by mouth at bedtime.   10 tablet   0   . diazepam (VALIUM) 2 MG tablet   Oral   Take 0.5-1 mg by mouth once.           BP 120/73  Pulse 85  Temp(Src) 97.7 F (36.5 C) (Oral)  Resp 17  SpO2 90%  Physical Exam  Nursing note and vitals reviewed. Constitutional: He is oriented to person, place, and time. He appears well-developed and well-nourished.  HENT:  Head: Normocephalic and atraumatic.  Mouth/Throat: Oropharynx is clear and moist.  No bite marks to the tongue. Normal dentition  Cardiovascular: Normal rate and regular rhythm.   No murmur heard. Pulmonary/Chest: Effort normal and breath sounds normal. No respiratory distress. He has no wheezes. He has no rales.  Abdominal: Soft. There is no tenderness. There is no rebound and no guarding.  Musculoskeletal: Normal range of motion. He exhibits no edema and no tenderness.  Neurological: He is alert and oriented to person, place, and time.  Skin: Skin is warm.  Psychiatric: He has a normal mood and affect. His behavior is normal.    ED Course  Procedures   Results for orders placed during the hospital encounter of 04/29/13  CBC WITH DIFFERENTIAL      Result Value Range   WBC 7.3  4.0 - 10.5 K/uL   RBC 4.56  4.22 - 5.81 MIL/uL   Hemoglobin 13.9  13.0 - 17.0 g/dL   HCT 16.1  09.6 - 04.5 %   MCV 87.1  78.0 - 100.0 fL   MCH 30.5  26.0 - 34.0 pg   MCHC 35.0  30.0 - 36.0 g/dL   RDW 40.9  81.1 - 91.4 %   Platelets 236  150 - 400 K/uL   Neutrophils Relative % 48  43 - 77 %   Neutro Abs 3.5  1.7 - 7.7 K/uL   Lymphocytes Relative 40  12 - 46 %   Lymphs Abs 2.9  0.7 - 4.0 K/uL   Monocytes Relative 7  3 - 12 %   Monocytes Absolute 0.5  0.1 - 1.0 K/uL   Eosinophils Relative 5  0 - 5 %   Eosinophils Absolute 0.4  0.0 - 0.7 K/uL    Basophils Relative 0  0 - 1 %   Basophils Absolute 0.0  0.0 - 0.1 K/uL  COMPREHENSIVE METABOLIC PANEL      Result Value Range   Sodium 139  135 - 145 mEq/L   Potassium 3.1 (*) 3.5 - 5.1 mEq/L   Chloride 103  96 - 112 mEq/L   CO2 25  19 - 32 mEq/L   Glucose, Bld 105 (*) 70 - 99 mg/dL   BUN 8  6 - 23 mg/dL   Creatinine, Ser 7.82  0.50 - 1.35 mg/dL   Calcium 8.6  8.4 - 95.6 mg/dL   Total Protein 6.4  6.0 - 8.3 g/dL   Albumin 3.7  3.5 - 5.2 g/dL   AST 23  0 - 37 U/L   ALT 30  0 - 53 U/L   Alkaline Phosphatase 60  39 - 117 U/L   Total Bilirubin 0.2 (*) 0.3 - 1.2 mg/dL   GFR calc non Af Amer >90  >90 mL/min   GFR calc Af Amer >90  >90 mL/min  URINE RAPID DRUG SCREEN (HOSP PERFORMED)      Result Value Range   Opiates NONE DETECTED  NONE DETECTED   Cocaine NONE DETECTED  NONE DETECTED   Benzodiazepines POSITIVE (*) NONE DETECTED   Amphetamines NONE DETECTED  NONE DETECTED   Tetrahydrocannabinol POSITIVE (*) NONE DETECTED   Barbiturates NONE DETECTED  NONE DETECTED  ETHANOL      Result Value Range   Alcohol, Ethyl (B) <11  0 - 11 mg/dL        1. Seizure       MDM  1:35 AM patient seen and evaluated. Patient is awake and alert x3. The does not appear confused or postictal period unremarkable vital signs. CBG by EMS was 95. Will evaluate for possible seizure with basic lab testing and drug screen.  Patient had normal head CT earlier this year. Patient was discussed with attending physician and at this time no need to repeat CT as he continues to appear well and has no head trauma.  Slight hypokalemia. Potassium given.  Patient has continued to do well while in the emergency department. No concerning findings on testing today. Will give referral to a neurologist for further evaluation. Patient agrees to this plan and is ready to return home. He was advised not to drive or operate machinery for 6  months.     Angus Seller, PA-C 04/29/13 [phone]

## 2013-04-29 NOTE — ED Provider Notes (Signed)
Medical screening examination/treatment/procedure(s) were performed by non-physician practitioner and as supervising physician I was immediately available for consultation/collaboration.   Hanley Seamen, MD 04/29/13 [phone]

## 2013-04-29 NOTE — ED Notes (Signed)
Per EMS, Pt has recent hx of seizures.  Multiple times in last two months.  Wife stated 2 seizures tonight prior to EMS arrival.  6-7 times in last 2 months.  No childhood history.  EMS started IV 20 g in left hand.  Pt appeared to hyperventilate in EMS transport and "passed out".  Sats would plummet ? Sensor.  Vitasl 140/90, hr 100-110, cbg 95.  Hx asthma,

## 2013-05-02 ENCOUNTER — Encounter (HOSPITAL_COMMUNITY): Payer: Self-pay | Admitting: Emergency Medicine

## 2013-05-02 ENCOUNTER — Emergency Department (HOSPITAL_COMMUNITY)
Admission: EM | Admit: 2013-05-02 | Discharge: 2013-05-02 | Disposition: A | Discharge status: Home / Self Care | Payer: Self-pay | Attending: Emergency Medicine | Admitting: Emergency Medicine

## 2013-05-02 ENCOUNTER — Emergency Department (HOSPITAL_COMMUNITY): Payer: Self-pay

## 2013-05-02 DIAGNOSIS — G40909 Epilepsy, unspecified, not intractable, without status epilepticus: Secondary | ICD-10-CM | POA: Insufficient documentation

## 2013-05-02 DIAGNOSIS — J45909 Unspecified asthma, uncomplicated: Secondary | ICD-10-CM | POA: Insufficient documentation

## 2013-05-02 DIAGNOSIS — R569 Unspecified convulsions: Secondary | ICD-10-CM

## 2013-05-02 DIAGNOSIS — Z79899 Other long term (current) drug therapy: Secondary | ICD-10-CM | POA: Insufficient documentation

## 2013-05-02 LAB — POCT I-STAT, CHEM 8
BUN: 7 mg/dL (ref 6–23)
Calcium, Ion: 1.2 mmol/L (ref 1.12–1.23)
Glucose, Bld: 93 mg/dL (ref 70–99)
TCO2: 23 mmol/L (ref 0–100)

## 2013-05-02 MED ORDER — SODIUM CHLORIDE 0.9 % IV SOLN
1000.0000 mg | Freq: Once | INTRAVENOUS | Status: AC
  Administered 2013-05-02: 1000 mg via INTRAVENOUS
  Filled 2013-05-02 (×2): qty 10

## 2013-05-02 MED ORDER — LEVETIRACETAM ER 500 MG PO TB24: 500.0000 mg | ORAL_TABLET | Freq: Every day | ORAL | Status: DC

## 2013-05-02 NOTE — ED Notes (Signed)
Pt left before receiving discharge instructions and prescriptions. Called family to notify pt to come back. Gave report to French Ana, RN at [facility]. MD notified.

## 2013-05-02 NOTE — ED Provider Notes (Signed)
History     CSN: [id number]  Arrival date & time 05/02/13  1114   First MD Initiated Contact with Patient 05/02/13 1115      Chief Complaint  Patient presents with  . Seizures     HPI EMS brought patient in with seizure activity at home.  Patient had recent ER visit with similar complaints.  Had a normal CT of the head done in January of this year.  Patient was referred to neurology but has not seen him yet.  Had drug screen done 2 days ago which showed no cocaine.  Patient received benzodiazepines en route.  Appears medicated or postictal period Past Medical History  Diagnosis Date  . Asthma     Past Surgical History  Procedure Laterality Date  . Appendectomy      No family history on file.  History  Substance Use Topics  . Smoking status: Never Smoker   . Smokeless tobacco: Not on file  . Alcohol Use: Yes     Comment: occassional      Review of Systems  Unable to perform ROS: Other    Allergies  Review of patient's allergies indicates no known allergies.  Home Medications   Current Outpatient Rx  Name  Route  Sig  Dispense  Refill  . albuterol (PROVENTIL HFA;VENTOLIN HFA) 108 (90 BASE) MCG/ACT inhaler   Inhalation   Inhale 2 puffs into the lungs every 6 (six) hours as needed. Wheezing         . levETIRAcetam (KEPPRA XR) 500 MG 24 hr tablet   Oral   Take 1 tablet (500 mg total) by mouth daily.   60 tablet   0     BP 122/71  Pulse 78  Temp(Src) 98.8 F (37.1 C) (Oral)  Resp 15  SpO2 95%  Physical Exam  Nursing note and vitals reviewed. Constitutional: He is oriented to person, place, and time. He appears well-developed and well-nourished. No distress.  HENT:  Head: Normocephalic and atraumatic.  No lacerations or contusions of the tongue noted  Eyes: Pupils are equal, round, and reactive to light.  Neck: Normal range of motion.  Cardiovascular: Normal rate and intact distal pulses.   Pulmonary/Chest: No respiratory distress.  Abdominal:  Normal appearance. He exhibits no distension. There is no tenderness. There is no rebound.  Musculoskeletal: Normal range of motion.  Neurological: He is alert and oriented to person, place, and time. No cranial nerve deficit.  Appears postictal  Skin: Skin is warm and dry. No rash noted.  Psychiatric: He has a normal mood and affect. His behavior is normal.    ED Course  Procedures (including critical care time) Medications  levETIRAcetam (KEPPRA) 1,000 mg in sodium chloride 0.9 % 100 mL IVPB (0 mg Intravenous Stopped 05/02/13 1423)    Labs Reviewed  POCT I-STAT, CHEM 8 - Abnormal; Notable for the following:    Potassium 3.3 (*)    All other components within normal limits   Dg Chest Portable 1 View  05/02/2013   *RADIOLOGY REPORT*  Clinical Data: Seizure activity.  History of asthma.  PORTABLE CHEST - 1 VIEW  Comparison: 11/28/2012.  Findings: Trachea is midline.  Heart size normal.  Lungs are clear. No pleural fluid.  IMPRESSION: No acute findings.   Original Report Authenticated By: Leanna Battles, M.D.     1. Seizure disorder       MDM  Patient was seen by neurology.  Recommended IV Keppra with prescription in outpatient followup with neurology.  Nelia Shi, MD 05/03/13 1051

## 2013-05-02 NOTE — ED Notes (Signed)
Sister - Aby Gessel: contact number  [phone]   Brother inlaw - Tim:    [phone]

## 2013-05-02 NOTE — ED Notes (Signed)
Neurology at bedside.

## 2013-05-02 NOTE — Consult Note (Signed)
NEURO HOSPITALIST CONSULT NOTE    Reason for Consult: Witnessed generalized seizure.  HPI:                                                                                                                                          Zachary Morales is an 27 y.o. male with a history of asthma and possible seizure disorder who was brought to the emergency room following a witnessed generalized tonic-clonic seizure. Patient was confused on arrival. He reportedly was combative en route to the hospital and was given Haldol and Versed. He was evaluated in the emergency room here 3 days ago for an episode of apparent loss of consciousness thought to possibly be generalized seizure. Routine laboratory studies were unremarkable. Patient was given an appointment for outpatient neurology evaluation for possible seizure disorder. He was given an anticonvulsant medication. He had a CT scan of his head January 2014 which was normal.  Past Medical History  Diagnosis Date  . Asthma     Past Surgical History  Procedure Laterality Date  . Appendectomy      No family history on file.    Social History:  reports that he has never smoked. He does not have any smokeless tobacco history on file. He reports that  drinks alcohol. He reports that he uses illicit drugs (Marijuana).  No Known Allergies  MEDICATIONS:                                                                                                                     I have reviewed the patient's current medications. Prior to Admission:  Proventil inhaler  ROS:  History obtained from the patient  General ROS: negative for - chills, fatigue, fever, night sweats, weight gain or weight loss Psychological ROS: negative for - behavioral disorder, hallucinations, memory difficulties, mood swings or  suicidal ideation Ophthalmic ROS: negative for - blurry vision, double vision, eye pain or loss of vision ENT ROS: negative for - epistaxis, nasal discharge, oral lesions, sore throat, tinnitus or vertigo Allergy and Immunology ROS: negative for - hives or itchy/watery eyes Hematological and Lymphatic ROS: negative for - bleeding problems, bruising or swollen lymph nodes Endocrine ROS: negative for - galactorrhea, hair pattern changes, polydipsia/polyuria or temperature intolerance Respiratory ROS: negative for - cough, hemoptysis, shortness of breath or wheezing Cardiovascular ROS: negative for - chest pain, dyspnea on exertion, edema or irregular heartbeat Gastrointestinal ROS: negative for - abdominal pain, diarrhea, hematemesis, nausea/vomiting or stool incontinence Genito-Urinary ROS: negative for - dysuria, hematuria, incontinence or urinary frequency/urgency Musculoskeletal ROS: negative for - joint swelling or muscular weakness Neurological ROS: as noted in HPI Dermatological ROS: negative for rash and skin lesion changes   Blood pressure 142/89, pulse 104, temperature 98.8 F (37.1 C), temperature source Oral, resp. rate 15, SpO2 100.00%.   Neurologic Examination:                                                                                                      Mental Status: Alert, oriented x3.  Speech fluent without evidence of aphasia. Able to follow commands without difficulty. Cranial Nerves: II-Visual fields were normal. III/IV/VI-Pupils were equal and reacted. Extraocular movements were full and conjugate.    V/VII- no facial weakness. VIII-normal. X-normal speech and symmetrical palatal movement. XII-midline tongue extension Motor: 5/5 bilaterally with normal tone and bulk Sensory: Normal throughout. Deep Tendon Reflexes: 2+ and symmetric. Plantars: Flexor bilaterally Cerebellar: Normal finger-to-nose testing.  No results found for this basename: cbc, bmp, coags,  chol, tri, ldl, hga1c    Results for orders placed during the hospital encounter of 05/02/13 (from the past 48 hour(s))  POCT I-STAT, CHEM 8     Status: Abnormal   Collection Time    05/02/13 11:46 AM      Result Value Range   Sodium 142  135 - 145 mEq/L   Potassium 3.3 (*) 3.5 - 5.1 mEq/L   Chloride 108  96 - 112 mEq/L   BUN 7  6 - 23 mg/dL   Creatinine, Ser 1.61  0.50 - 1.35 mg/dL   Glucose, Bld 93  70 - 99 mg/dL   Calcium, Ion 0.96  1.12 - 1.23 mmol/L   TCO2 23  0 - 100 mmol/L   Hemoglobin 16.3  13.0 - 17.0 g/dL   HCT 04.5  40.9 - 81.1 %    Dg Chest Portable 1 View  05/02/2013   *RADIOLOGY REPORT*  Clinical Data: Seizure activity.  History of asthma.  PORTABLE CHEST - 1 VIEW  Comparison: 11/28/2012.  Findings: Trachea is midline.  Heart size normal.  Lungs are clear. No pleural fluid.  IMPRESSION: No acute findings.   Original Report Authenticated By: Leanna Battles, M.D.  Assessment/Plan: Generalized tonic-clonic seizure disorder.  Recommendations: 1. Loading dose of Keppra 1000 mg IV, then 500 mg twice a day daily by mouth. 2. Followup neurology outpatient in 2-4 weeks. 3. No operating of a motor vehicle nor heavy or potentially dangerous equipment until cleared by primary physician or neurologist.  Venetia Maxon M.D. [facility] [phone]  05/02/2013, 1:51 PM

## 2013-05-02 NOTE — ED Notes (Addendum)
Per EMS pt had witnessed "seizure-like activity" by family today. EMS saw pt "thrashing arms and legs with eyes rolling back" for approximately 30 seconds. Pt does not have hx of seizures. Pupils equal and reactive. No incontinence noted. Pt was combative with EMS and given 5mg  Haldol and 2.5 midazolam en route.

## 2013-07-16 ENCOUNTER — Emergency Department (HOSPITAL_BASED_OUTPATIENT_CLINIC_OR_DEPARTMENT_OTHER)
Admission: EM | Admit: 2013-07-16 | Discharge: 2013-07-16 | Disposition: A | Discharge status: Home / Self Care | Payer: Self-pay | Attending: Emergency Medicine | Admitting: Emergency Medicine

## 2013-07-16 ENCOUNTER — Encounter (HOSPITAL_BASED_OUTPATIENT_CLINIC_OR_DEPARTMENT_OTHER): Payer: Self-pay | Admitting: Emergency Medicine

## 2013-07-16 ENCOUNTER — Emergency Department (HOSPITAL_BASED_OUTPATIENT_CLINIC_OR_DEPARTMENT_OTHER): Payer: Self-pay

## 2013-07-16 DIAGNOSIS — S92309A Fracture of unspecified metatarsal bone(s), unspecified foot, initial encounter for closed fracture: Secondary | ICD-10-CM | POA: Insufficient documentation

## 2013-07-16 DIAGNOSIS — Y939 Activity, unspecified: Secondary | ICD-10-CM | POA: Insufficient documentation

## 2013-07-16 DIAGNOSIS — J45909 Unspecified asthma, uncomplicated: Secondary | ICD-10-CM | POA: Insufficient documentation

## 2013-07-16 DIAGNOSIS — IMO0002 Reserved for concepts with insufficient information to code with codable children: Secondary | ICD-10-CM | POA: Insufficient documentation

## 2013-07-16 DIAGNOSIS — Y929 Unspecified place or not applicable: Secondary | ICD-10-CM | POA: Insufficient documentation

## 2013-07-16 DIAGNOSIS — S92311A Displaced fracture of first metatarsal bone, right foot, initial encounter for closed fracture: Secondary | ICD-10-CM

## 2013-07-16 HISTORY — DX: Anxiety disorder, unspecified: F41.9

## 2013-07-16 HISTORY — DX: Depression, unspecified: F32.A

## 2013-07-16 HISTORY — DX: Major depressive disorder, single episode, unspecified: F32.9

## 2013-07-16 MED ORDER — HYDROCODONE-ACETAMINOPHEN 5-325 MG PO TABS: 1.0000 | ORAL_TABLET | Freq: Four times a day (QID) | ORAL | Status: DC | PRN

## 2013-07-16 MED ORDER — HYDROCODONE-ACETAMINOPHEN 5-325 MG PO TABS
1.0000 | ORAL_TABLET | Freq: Once | ORAL | Status: AC
  Administered 2013-07-16: 1 via ORAL
  Filled 2013-07-16: qty 1

## 2013-07-16 NOTE — ED Notes (Signed)
Pt states he is supposed to be on psych meds, but he has not taken them in 8 months.

## 2013-07-16 NOTE — ED Provider Notes (Signed)
CSN: [id number]     Arrival date & time 07/20/2013  0248 History   First MD Initiated Contact with Patient 07-20-13 0258     Chief Complaint  Patient presents with  . Foot Injury   (Consider location/radiation/quality/duration/timing/severity/associated sxs/prior Treatment) HPI This is a 27 year old male who dropped an Health visitor chair onto his right foot about midnight. He is subsequently developed severe pain in the right foot, primarily in the arch. There is associated dorsal swelling and ecchymosis. Pain is worse with movement or palpation and he is having difficulty bearing weight on it. He has limited range of motion of the right great toe due to pain. There is no numbness or weakness distally. He denies other injury but states the right foot pain is radiating to his right lower leg.  Past Medical History  Diagnosis Date  . Asthma    Past Surgical History  Procedure Laterality Date  . Appendectomy     No family history on file. History  Substance Use Topics  . Smoking status: Never Smoker   . Smokeless tobacco: Not on file  . Alcohol Use: Yes     Comment: occassional    Review of Systems  All other systems reviewed and are negative.    Allergies  Review of patient's allergies indicates no known allergies.  Home Medications   Current Outpatient Rx  Name  Route  Sig  Dispense  Refill  . albuterol (PROVENTIL HFA;VENTOLIN HFA) 108 (90 BASE) MCG/ACT inhaler   Inhalation   Inhale 2 puffs into the lungs every 6 (six) hours as needed. Wheezing         . levETIRAcetam (KEPPRA XR) 500 MG 24 hr tablet   Oral   Take 1 tablet (500 mg total) by mouth daily.   60 tablet   0    BP 127/81  Pulse 86  Temp(Src) 97.8 F (36.6 C) (Oral)  Resp 18  Ht 5\' 8"  (1.727 m)  Wt 160 lb (72.576 kg)  BMI 24.33 kg/m2  SpO2 100%  Physical Exam General: Well-developed, well-nourished male in no acute distress; appearance consistent with age of record HENT: normocephalic;  atraumatic Eyes: pupils equal, round and reactive to light; extraocular muscles intact Neck: supple Heart: regular rate and rhythm Lungs: clear to auscultation bilaterally Abdomen: soft; nondistended Extremities: No deformity; tenderness with swelling and ecchymosis of the dorsal right foot; right foot is distally neurovascularly intact with intact tendon function but range of motion of the right great toe is limited due to pain and swelling; the right ankle is stable without tenderness; pulses are normal Neurologic: Awake, alert and oriented; motor function intact in all extremities and symmetric; no facial droop Skin: Warm and dry Psychiatric: Normal mood and affect    ED Course  Procedures (including critical care time)  MDM  Nursing notes and vitals signs, including pulse oximetry, reviewed.  Summary of this visit's results, reviewed by myself:  Imaging Studies: Dg Foot Complete Right  07/20/13   *RADIOLOGY REPORT*  Clinical Data: Trauma, foot pain  RIGHT FOOT COMPLETE - 3+ VIEW  Comparison: None.  Findings: Nondisplaced and mildly comminuted fracture through the base of the first metatarsal.  The fracture line extends toward the TMT joint.  No dislocation.  No additional fracture identified.  IMPRESSION: Mildly comminuted nondisplaced fracture involving the proximal aspect of the first metatarsal.   Original Report Authenticated By: Jearld Lesch, M.D.        Hanley Seamen, MD Jul 20, 2013 [phone]

## 2013-07-16 NOTE — ED Notes (Signed)
Pt states a barber chair fell on foot at midnight. C/o right foot pain.

## 2015-05-22 ENCOUNTER — Emergency Department (HOSPITAL_COMMUNITY): Payer: Self-pay

## 2015-05-22 ENCOUNTER — Emergency Department (HOSPITAL_COMMUNITY)
Admission: EM | Admit: 2015-05-22 | Discharge: 2015-05-22 | Disposition: A | Discharge status: Home / Self Care | Payer: Self-pay | Attending: Emergency Medicine | Admitting: Emergency Medicine

## 2015-05-22 ENCOUNTER — Encounter (HOSPITAL_COMMUNITY): Payer: Self-pay | Admitting: *Deleted

## 2015-05-22 DIAGNOSIS — R0789 Other chest pain: Secondary | ICD-10-CM | POA: Insufficient documentation

## 2015-05-22 DIAGNOSIS — R569 Unspecified convulsions: Secondary | ICD-10-CM

## 2015-05-22 DIAGNOSIS — S0990XA Unspecified injury of head, initial encounter: Secondary | ICD-10-CM | POA: Insufficient documentation

## 2015-05-22 DIAGNOSIS — Z8659 Personal history of other mental and behavioral disorders: Secondary | ICD-10-CM | POA: Insufficient documentation

## 2015-05-22 DIAGNOSIS — R079 Chest pain, unspecified: Secondary | ICD-10-CM

## 2015-05-22 DIAGNOSIS — Y9389 Activity, other specified: Secondary | ICD-10-CM | POA: Insufficient documentation

## 2015-05-22 DIAGNOSIS — W08XXXA Fall from other furniture, initial encounter: Secondary | ICD-10-CM | POA: Insufficient documentation

## 2015-05-22 DIAGNOSIS — Y9289 Other specified places as the place of occurrence of the external cause: Secondary | ICD-10-CM | POA: Insufficient documentation

## 2015-05-22 DIAGNOSIS — Z79899 Other long term (current) drug therapy: Secondary | ICD-10-CM | POA: Insufficient documentation

## 2015-05-22 DIAGNOSIS — J45909 Unspecified asthma, uncomplicated: Secondary | ICD-10-CM | POA: Insufficient documentation

## 2015-05-22 DIAGNOSIS — G40909 Epilepsy, unspecified, not intractable, without status epilepticus: Secondary | ICD-10-CM | POA: Insufficient documentation

## 2015-05-22 DIAGNOSIS — S199XXA Unspecified injury of neck, initial encounter: Secondary | ICD-10-CM | POA: Insufficient documentation

## 2015-05-22 DIAGNOSIS — Y998 Other external cause status: Secondary | ICD-10-CM | POA: Insufficient documentation

## 2015-05-22 HISTORY — DX: Unspecified convulsions: R56.9

## 2015-05-22 LAB — RAPID URINE DRUG SCREEN, HOSP PERFORMED
AMPHETAMINES: NOT DETECTED
Barbiturates: NOT DETECTED
Benzodiazepines: NOT DETECTED
COCAINE: NOT DETECTED
OPIATES: NOT DETECTED
Tetrahydrocannabinol: POSITIVE — AB

## 2015-05-22 LAB — COMPREHENSIVE METABOLIC PANEL
ALBUMIN: 4.2 g/dL (ref 3.5–5.0)
ALT: 25 U/L (ref 17–63)
ANION GAP: 7 (ref 5–15)
AST: 27 U/L (ref 15–41)
Alkaline Phosphatase: 63 U/L (ref 38–126)
BUN: 15 mg/dL (ref 6–20)
CALCIUM: 8.9 mg/dL (ref 8.9–10.3)
CO2: 23 mmol/L (ref 22–32)
Chloride: 107 mmol/L (ref 101–111)
Creatinine, Ser: 0.81 mg/dL (ref 0.61–1.24)
GLUCOSE: 121 mg/dL — AB (ref 65–99)
Potassium: 3.3 mmol/L — ABNORMAL LOW (ref 3.5–5.1)
SODIUM: 137 mmol/L (ref 135–145)
TOTAL PROTEIN: 6.7 g/dL (ref 6.5–8.1)
Total Bilirubin: 0.7 mg/dL (ref 0.3–1.2)

## 2015-05-22 LAB — CBC WITH DIFFERENTIAL/PLATELET
BASOS PCT: 1 % (ref 0–1)
Basophils Absolute: 0 10*3/uL (ref 0.0–0.1)
EOS ABS: 0.2 10*3/uL (ref 0.0–0.7)
Eosinophils Relative: 3 % (ref 0–5)
HEMATOCRIT: 42.1 % (ref 39.0–52.0)
Hemoglobin: 14.4 g/dL (ref 13.0–17.0)
LYMPHS ABS: 1.7 10*3/uL (ref 0.7–4.0)
LYMPHS PCT: 20 % (ref 12–46)
MCH: 29.9 pg (ref 26.0–34.0)
MCHC: 34.2 g/dL (ref 30.0–36.0)
MCV: 87.3 fL (ref 78.0–100.0)
MONO ABS: 0.8 10*3/uL (ref 0.1–1.0)
Monocytes Relative: 9 % (ref 3–12)
NEUTROS ABS: 5.8 10*3/uL (ref 1.7–7.7)
NEUTROS PCT: 67 % (ref 43–77)
PLATELETS: 217 10*3/uL (ref 150–400)
RBC: 4.82 MIL/uL (ref 4.22–5.81)
RDW: 12.7 % (ref 11.5–15.5)
WBC: 8.5 10*3/uL (ref 4.0–10.5)

## 2015-05-22 LAB — URINALYSIS, ROUTINE W REFLEX MICROSCOPIC
Bilirubin Urine: NEGATIVE
GLUCOSE, UA: NEGATIVE mg/dL
HGB URINE DIPSTICK: NEGATIVE
Ketones, ur: NEGATIVE mg/dL
LEUKOCYTES UA: NEGATIVE
NITRITE: NEGATIVE
PH: 6 (ref 5.0–8.0)
PROTEIN: NEGATIVE mg/dL
Specific Gravity, Urine: 1.031 — ABNORMAL HIGH (ref 1.005–1.030)
Urobilinogen, UA: 0.2 mg/dL (ref 0.0–1.0)

## 2015-05-22 LAB — I-STAT TROPONIN, ED: Troponin i, poc: 0 ng/mL (ref 0.00–0.08)

## 2015-05-22 MED ORDER — SODIUM CHLORIDE 0.9 % IV SOLN
1000.0000 mg | Freq: Once | INTRAVENOUS | Status: AC
  Administered 2015-05-22: 1000 mg via INTRAVENOUS
  Filled 2015-05-22: qty 10

## 2015-05-22 MED ORDER — LEVETIRACETAM 500 MG PO TABS: 500.0000 mg | ORAL_TABLET | Freq: Two times a day (BID) | ORAL | Status: AC

## 2015-05-22 MED ORDER — SODIUM CHLORIDE 0.9 % IV SOLN
INTRAVENOUS | Status: DC
  Administered 2015-05-22: 03:00:00 via INTRAVENOUS

## 2015-05-22 MED ORDER — FENTANYL CITRATE (PF) 100 MCG/2ML IJ SOLN
50.0000 ug | Freq: Once | INTRAMUSCULAR | Status: AC
  Administered 2015-05-22: 50 ug via INTRAVENOUS
  Filled 2015-05-22: qty 2

## 2015-05-22 NOTE — ED Provider Notes (Signed)
TIME SEEN: 2:20 PM  CHIEF COMPLAINT: Seizure  HPI: Pt is a 29 y.o. male with history of asthma, depression, anxiety and pseudoseizures who is not on antiepileptics who presents to the emergency department after he had 3 back-to-back tonic-clonic seizures that lasted less than 60 seconds each at home. Wife reports he has been stressed more than normal recently and she thinks this is what triggered his seizures. He states that he does not have any money and that is why he is not taking any antiepileptics and why he has not followed up with a neurologist. Denies any fever, cough, vomiting or diarrhea. Wife reports he did fall off the couch and hit his head on the floor. Not on anticoagulation. Denies numbness, tingling or focal weakness. Complaining of headache with photophobia currently. No fever, rash. Patient also complaining of chest pressure and shortness of breath. No history of PE or DVT. No history of premature CAD in his family.  ROS: See HPI Constitutional: no fever  Eyes: no drainage  ENT: no runny nose   Cardiovascular:  no chest pain  Resp: no SOB  GI: no vomiting GU: no dysuria Integumentary: no rash  Allergy: no hives  Musculoskeletal: no leg swelling  Neurological: no slurred speech ROS otherwise negative  PAST MEDICAL HISTORY/PAST SURGICAL HISTORY:  Past Medical History  Diagnosis Date  . Asthma   . Depression   . Anxiety   . Seizures     MEDICATIONS:  Prior to Admission medications   Medication Sig Start Date End Date Taking? Authorizing Provider  albuterol (PROVENTIL HFA;VENTOLIN HFA) 108 (90 BASE) MCG/ACT inhaler Inhale 2 puffs into the lungs every 6 (six) hours as needed. Wheezing    Historical Provider, MD  HYDROcodone-acetaminophen (NORCO/VICODIN) 5-325 MG per tablet Take 1-2 tablets by mouth every 6 (six) hours as needed for pain. 07/16/13   John Molpus, MD  levETIRAcetam (KEPPRA XR) 500 MG 24 hr tablet Take 1 tablet (500 mg total) by mouth daily. 05/02/13    Nelva Nayobert Beaton, MD  naproxen sodium (ANAPROX) 220 MG tablet Take 220 mg by mouth once.    Historical Provider, MD    ALLERGIES:  No Known Allergies  SOCIAL HISTORY:  History  Substance Use Topics  . Smoking status: Never Smoker   . Smokeless tobacco: Not on file  . Alcohol Use: Yes     Comment: occassional    FAMILY HISTORY: No family history on file.  EXAM: BP 116/58 mmHg  Pulse 70  Temp(Src) 97.8 F (36.6 C) (Axillary)  Resp 16  SpO2 100% CONSTITUTIONAL: Alert and oriented and responds appropriately to questions. Well-appearing; well-nourished HEAD: Normocephalic, atraumatic EYES: Conjunctivae clear, PERRL ENT: normal nose; no rhinorrhea; moist mucous membranes; pharynx without lesions noted NECK: Supple, no meningismus, no LAD; patient has upper C-spine tenderness without step-off or deformity, cervical collar in place CARD: RRR; S1 and S2 appreciated; no murmurs, no clicks, no rubs, no gallops RESP: Normal chest excursion without splinting or tachypnea; breath sounds clear and equal bilaterally; no wheezes, no rhonchi, no rales, no hypoxia or respiratory distress, speaking full sentences CHEST:  Mildly tender to palpation over the left chest wall without crepitus or ecchymosis or deformity ABD/GI: Normal bowel sounds; non-distended; soft, non-tender, no rebound, no guarding, no peritoneal signs BACK:  The back appears normal and is non-tender to palpation, there is no CVA tenderness; no midline spinal tenderness or step-off or deformity  EXT: Normal ROM in all joints; non-tender to palpation; no edema; normal capillary refill; no  cyanosis, no calf tenderness or swelling    SKIN: Normal color for age and race; warm NEURO: Moves all extremities equally, sensation to light touch intact diffusely, cranial nerves II through XII intact PSYCH: The patient's mood and manner are appropriate. Grooming and personal hygiene are appropriate.  MEDICAL DECISION MAKING: Patient here with  seizure. Not on antiepileptics. Has had seizures before. We'll obtain labs, urine, CT of his head and cervical spine given history of head injury with complaints of headache and neck pain. We'll also obtain chest x-ray. EKG shows no ischemic changes. Patient has early repolarization.  ED PROGRESS: Patient's labs are unremarkable. Urine drug screen positive for THC. He is oriented 3, neurologically intact. Able to ambulate and tolerate fluids. CT of his head and cervical spine show no acute injury and chest x-ray also clear. Have discussed with patient and wife at length that he cannot drive until he has been cleared by a neurologist. I have given him outpatient neurology follow-up information and discharged him on Keppra 500 mg twice a day. Have also provided him with a coupon for this prescription. Patient is very concerned about his ability to pay for outpatient services. We'll have Child psychotherapist contact him at home to see if there is any assistance he can obtain for prescriptions, outpatient follow-up with PCP and neurologist. Have also provided him a work note stating that he cannot operate machinery or drive. Patient is very upset by this but have explained to him at length that he could harm himself or others on the road if he is driving and has a seizure.      EKG Interpretation  Date/Time:  Friday May 22 2015 02:35:08 EDT Ventricular Rate:  70 PR Interval:  201 QRS Duration: 85 QT Interval:  394 QTC Calculation: 425 R Axis:   79 Text Interpretation:  Sinus rhythm ST elev, probable normal early repol pattern Confirmed by Ishmeal Rorie,  DO, Kensley Lares [id number]) on 05/22/2015 2:52:56 AM        Layla Maw Kachina Niederer, DO 05/22/15 6045

## 2015-05-22 NOTE — ED Notes (Signed)
Pt states he does not remember what happen, pt states he has a really bad headache right now. Pt a/o x 3 at this time.

## 2015-05-22 NOTE — ED Notes (Signed)
Bed: [id number] Expected date:  Expected time:  Means of arrival:  Comments: EMS 29 yo male/seizure-hx seizure-post-ictal-?fall

## 2015-05-22 NOTE — ED Notes (Signed)
Per EMS pt from home, wife stated he left bedroom and sat on couch, heard him mumbling her name, she walked out and he kept repeating her name, wife and pt did not remember a fall but pt has a chipped tooth and states having neck pain, pt complaining of headache and light sensitivity, no seizure activity for EMS, wife stated his last seizure was about year ago, happens when he gets stressed, does not take seizure medication, has not been diagnosed w/ seizures, just known to have seizures once a year, pt is currently a/o x 3, did not remember anything once he got home from work until EMS got there, CBG 96, BP 117/73, HR 75, 99% 2L [place], 20G L AC.Ccollar in place d/t neck pain

## 2015-05-22 NOTE — ED Notes (Signed)
Pt ambulated with assistance. He was weak and complained of being a little light headed. Pt also refused anything to eat or drink at this time.

## 2015-05-22 NOTE — Care Management Note (Addendum)
Case Management Note  Patient Details  Name: Zachary Morales MRN: [id number] Date of Birth: 08/29/1986  Subjective/Objective:        29 year old uninsured [place] pt working for [employer] and unable to get insurance coverage from [employer] until after he has been there a year (started work January 2016) Pt in with seizures, seen by Dr Elesa MassedWard EDP and [facility], pcp and neurology.  Pt informed CM he generally is seen by Dr Amada Kingfishergeorge osei bonsu as a pcp when he "needs to be seen and he works with me"  Pt d/c with keppra 500 bid -60 tabs Rx and asked to f/u with [place] or [place] [facility] after speaking with one of their business offices [facility] Last visits were noted to be in June 2014  Pt confirms no insurance at this time    Action/Plan: Called pt at 1251 at [address] and reviewed his ED visit for 05/22/15, inquired about pcp services, [facility], spoke with him about [facility] specialist services that may be available. Pt informed CM his license has been suspended from [employer], he can no longer drive at this time until he has a neurology consult and is cleared to return to work. Cm encouraged pt to call [place] and [place] [facility] offices to speak with their business office staff about payment plans if possible and CM will check with [facility] able possible neurology availability  1300 spoke with Kennyth ArnoldStacy of [facility] who states presently there are not neurology slots available but a possible options is Engineer, maintenance (IT)[facility] in [place] have available neurologist that may be able to assist pt but will want $200 up front  Will attempt to return call to pt at 4 pm per his request.  Kennyth ArnoldStacy to attempt to call pt also 1652 ED CM called pt at [phone] and left pt a voice message about [facility]. Cm left CM office number for pt    Discharge planning Services   community resources if available    Status  of Service:   completed    Additional Comments:  Ophelia ShoulderGibbs, Leahann Lempke Louise, RN 05/22/2015, 12:59 PM

## 2015-05-22 NOTE — ED Notes (Signed)
Pt. In xray 

## 2015-05-22 NOTE — Discharge Instructions (Signed)
Driving and Equipment Restrictions Some medical problems make it dangerous to drive, ride a bike, or use machines. Some of these problems are:  A hard blow to the head (concussion).  Passing out (fainting).  Twitching and shaking (seizures).  Low blood sugar.  Taking medicine to help you relax (sedatives).  Taking pain medicines.  Wearing an eye patch.  Wearing splints. This can make it hard to use parts of your body that you need to drive safely. HOME CARE   Do not drive until your doctor says it is okay.  Do not use machines until your doctor says it is okay. You may need a form signed by your doctor (medical release) before you can drive again. You may also need this form before you do other tasks where you need to be fully alert. MAKE SURE YOU:  Understand these instructions.  Will watch your condition.  Will get help right away if you are not doing well or get worse. Document Released: 12/15/2004 Document Revised: 01/30/2012 Document Reviewed: 03/17/2010 [facility] Patient Information 2015 [employer] [place]. This information is not intended to replace advice given to you by your health care provider. Make sure you discuss any questions you have with your health care provider.  Epilepsy Epilepsy is a disorder in which a person has repeated seizures over time. A seizure is a release of abnormal electrical activity in the brain. Seizures can cause a change in attention, behavior, or the ability to remain awake and alert (altered mental status). Seizures often involve uncontrollable shaking (convulsions).  Most people with epilepsy lead normal lives. However, people with epilepsy are at an increased risk of falls, accidents, and injuries. Therefore, it is important to begin treatment right away. CAUSES  Epilepsy has many possible causes. Anything that disturbs the normal pattern of brain cell activity can lead to seizures. This may include:   Head injury.  Birth trauma.  High  fever as a child.  Stroke.  Bleeding into or around the brain.  Certain drugs.  Prolonged low oxygen, such as what occurs after CPR efforts.  Abnormal brain development.  Certain illnesses, such as meningitis, encephalitis (brain infection), malaria, and other infections.  An imbalance of nerve signaling chemicals (neurotransmitters).  SIGNS AND SYMPTOMS  The symptoms of a seizure can vary greatly from one person to another. Right before a seizure, you may have a warning (aura) that a seizure is about to occur. An aura may include the following symptoms:  Fear or anxiety.  Nausea.  Feeling like the room is spinning (vertigo).  Vision changes, such as seeing flashing lights or spots. Common symptoms during a seizure include:  Abnormal sensations, such as an abnormal smell or a bitter taste in the mouth.   Sudden, general body stiffness.   Convulsions that involve rhythmic jerking of the face, arm, or leg on one or both sides.   Sudden change in consciousness.   Appearing to be awake but not responding.   Appearing to be asleep but cannot be awakened.   Grimacing, chewing, lip smacking, drooling, tongue biting, or loss of bowel or bladder control. After a seizure, you may feel sleepy for a while. DIAGNOSIS  Your health care provider will ask about your symptoms and take a medical history. Descriptions from any witnesses to your seizures will be very helpful in the diagnosis. A physical exam, including a detailed neurological exam, is necessary. Various tests may be done, such as:   An electroencephalogram (EEG). This is a painless test  of your brain waves. In this test, a diagram is created of your brain waves. These diagrams can be interpreted by a specialist.  An MRI of the brain.   A CT scan of the brain.   A spinal tap (lumbar puncture, LP).  Blood tests to check for signs of infection or abnormal blood chemistry. TREATMENT  There is no cure for  epilepsy, but it is generally treatable. Once epilepsy is diagnosed, it is important to begin treatment as soon as possible. For most people with epilepsy, seizures can be controlled with medicines. The following may also be used:  A pacemaker for the brain (vagus nerve stimulator) can be used for people with seizures that are not well controlled by medicine.  Surgery on the brain. For some people, epilepsy eventually goes away. HOME CARE INSTRUCTIONS   Follow your health care provider's recommendations on driving and safety in normal activities.  Get enough rest. Lack of sleep can cause seizures.  Only take over-the-counter or prescription medicines as directed by your health care provider. Take any prescribed medicine exactly as directed.  Avoid any known triggers of your seizures.  Keep a seizure diary. Record what you recall about any seizure, especially any possible trigger.   Make sure the people you live and work with know that you are prone to seizures. They should receive instructions on how to help you. In general, a witness to a seizure should:   Cushion your head and body.   Turn you on your side.   Avoid unnecessarily restraining you.   Not place anything inside your mouth.   Call for emergency medical help if there is any question about what has occurred.   Follow up with your health care provider as directed. You may need regular blood tests to monitor the levels of your medicine.  SEEK MEDICAL CARE IF:   You develop signs of infection or other illness. This might increase the risk of a seizure.   You seem to be having more frequent seizures.   Your seizure pattern is changing.  SEEK IMMEDIATE MEDICAL CARE IF:   You have a seizure that does not stop after a few moments.   You have a seizure that causes any difficulty in breathing.   You have a seizure that results in a very severe headache.   You have a seizure that leaves you with the  inability to speak or use a part of your body.  Document Released: 11/07/2005 Document Revised: 08/28/2013 Document Reviewed: 06/19/2013 [facility] Patient Information 2015 [place], [place]. This information is not intended to replace advice given to you by your health care provider. Make sure you discuss any questions you have with your health care provider.    Chest Pain (Nonspecific) It is often hard to give a specific diagnosis for the cause of chest pain. There is always a chance that your pain could be related to something serious, such as a heart attack or a blood clot in the lungs. You need to follow up with your health care provider for further evaluation. CAUSES   Heartburn.  Pneumonia or bronchitis.  Anxiety or stress.  Inflammation around your heart (pericarditis) or lung (pleuritis or pleurisy).  A blood clot in the lung.  A collapsed lung (pneumothorax). It can develop suddenly on its own (spontaneous pneumothorax) or from trauma to the chest.  Shingles infection (herpes zoster virus). The chest wall is composed of bones, muscles, and cartilage. Any of these can be the source of  the pain.  The bones can be bruised by injury.  The muscles or cartilage can be strained by coughing or overwork.  The cartilage can be affected by inflammation and become sore (costochondritis). DIAGNOSIS  Lab tests or other studies may be needed to find the cause of your pain. Your health care provider may have you take a test called an ambulatory electrocardiogram (ECG). An ECG records your heartbeat patterns over a 24-hour period. You may also have other tests, such as:  Transthoracic echocardiogram (TTE). During echocardiography, sound waves are used to evaluate how blood flows through your heart.  Transesophageal echocardiogram (TEE).  Cardiac monitoring. This allows your health care provider to monitor your heart rate and rhythm in real time.  Holter monitor. This is a portable device  that records your heartbeat and can help diagnose heart arrhythmias. It allows your health care provider to track your heart activity for several days, if needed.  Stress tests by exercise or by giving medicine that makes the heart beat faster. TREATMENT   Treatment depends on what may be causing your chest pain. Treatment may include:  Acid blockers for heartburn.  Anti-inflammatory medicine.  Pain medicine for inflammatory conditions.  Antibiotics if an infection is present.  You may be advised to change lifestyle habits. This includes stopping smoking and avoiding alcohol, caffeine, and chocolate.  You may be advised to keep your head raised (elevated) when sleeping. This reduces the chance of acid going backward from your stomach into your esophagus. Most of the time, nonspecific chest pain will improve within 2-3 days with rest and mild pain medicine.  HOME CARE INSTRUCTIONS   If antibiotics were prescribed, take them as directed. Finish them even if you start to feel better.  For the next few days, avoid physical activities that bring on chest pain. Continue physical activities as directed.  Do not use any tobacco products, including cigarettes, chewing tobacco, or electronic cigarettes.  Avoid drinking alcohol.  Only take medicine as directed by your health care provider.  Follow your health care provider's suggestions for further testing if your chest pain does not go away.  Keep any follow-up appointments you made. If you do not go to an appointment, you could develop lasting (chronic) problems with pain. If there is any problem keeping an appointment, call to reschedule. SEEK MEDICAL CARE IF:   Your chest pain does not go away, even after treatment.  You have a rash with blisters on your chest.  You have a fever. SEEK IMMEDIATE MEDICAL CARE IF:   You have increased chest pain or pain that spreads to your arm, neck, jaw, back, or abdomen.  You have shortness of  breath.  You have an increasing cough, or you cough up blood.  You have severe back or abdominal pain.  You feel nauseous or vomit.  You have severe weakness.  You faint.  You have chills. This is an emergency. Do not wait to see if the pain will go away. Get medical help at once. Call your local emergency services (911 in [place] Do not drive yourself to the hospital. MAKE SURE YOU:   Understand these instructions.  Will watch your condition.  Will get help right away if you are not doing well or get worse. Document Released: 08/17/2005 Document Revised: 11/12/2013 Document Reviewed: 06/12/2008 [facility] Patient Information 2015 [place], [place]. This information is not intended to replace advice given to you by your health care provider. Make sure you discuss any questions you have  with your health care provider. ° °

## 2015-05-22 NOTE — ED Notes (Signed)
EMS report pts wife stated they were arguing at her parents house, then went back home where they continued to argue, states pt left bedroom and when he did he started having seizure.

## 2015-08-27 IMAGING — CR DG CHEST 2V
2 series · 2 of 2 positions shown · non-contrast
Comparison: 05/02/2013

CLINICAL DATA: Seizure this morning.  Dyspnea and chest pain.

EXAM:
CHEST  2 VIEW

[w chest lat]
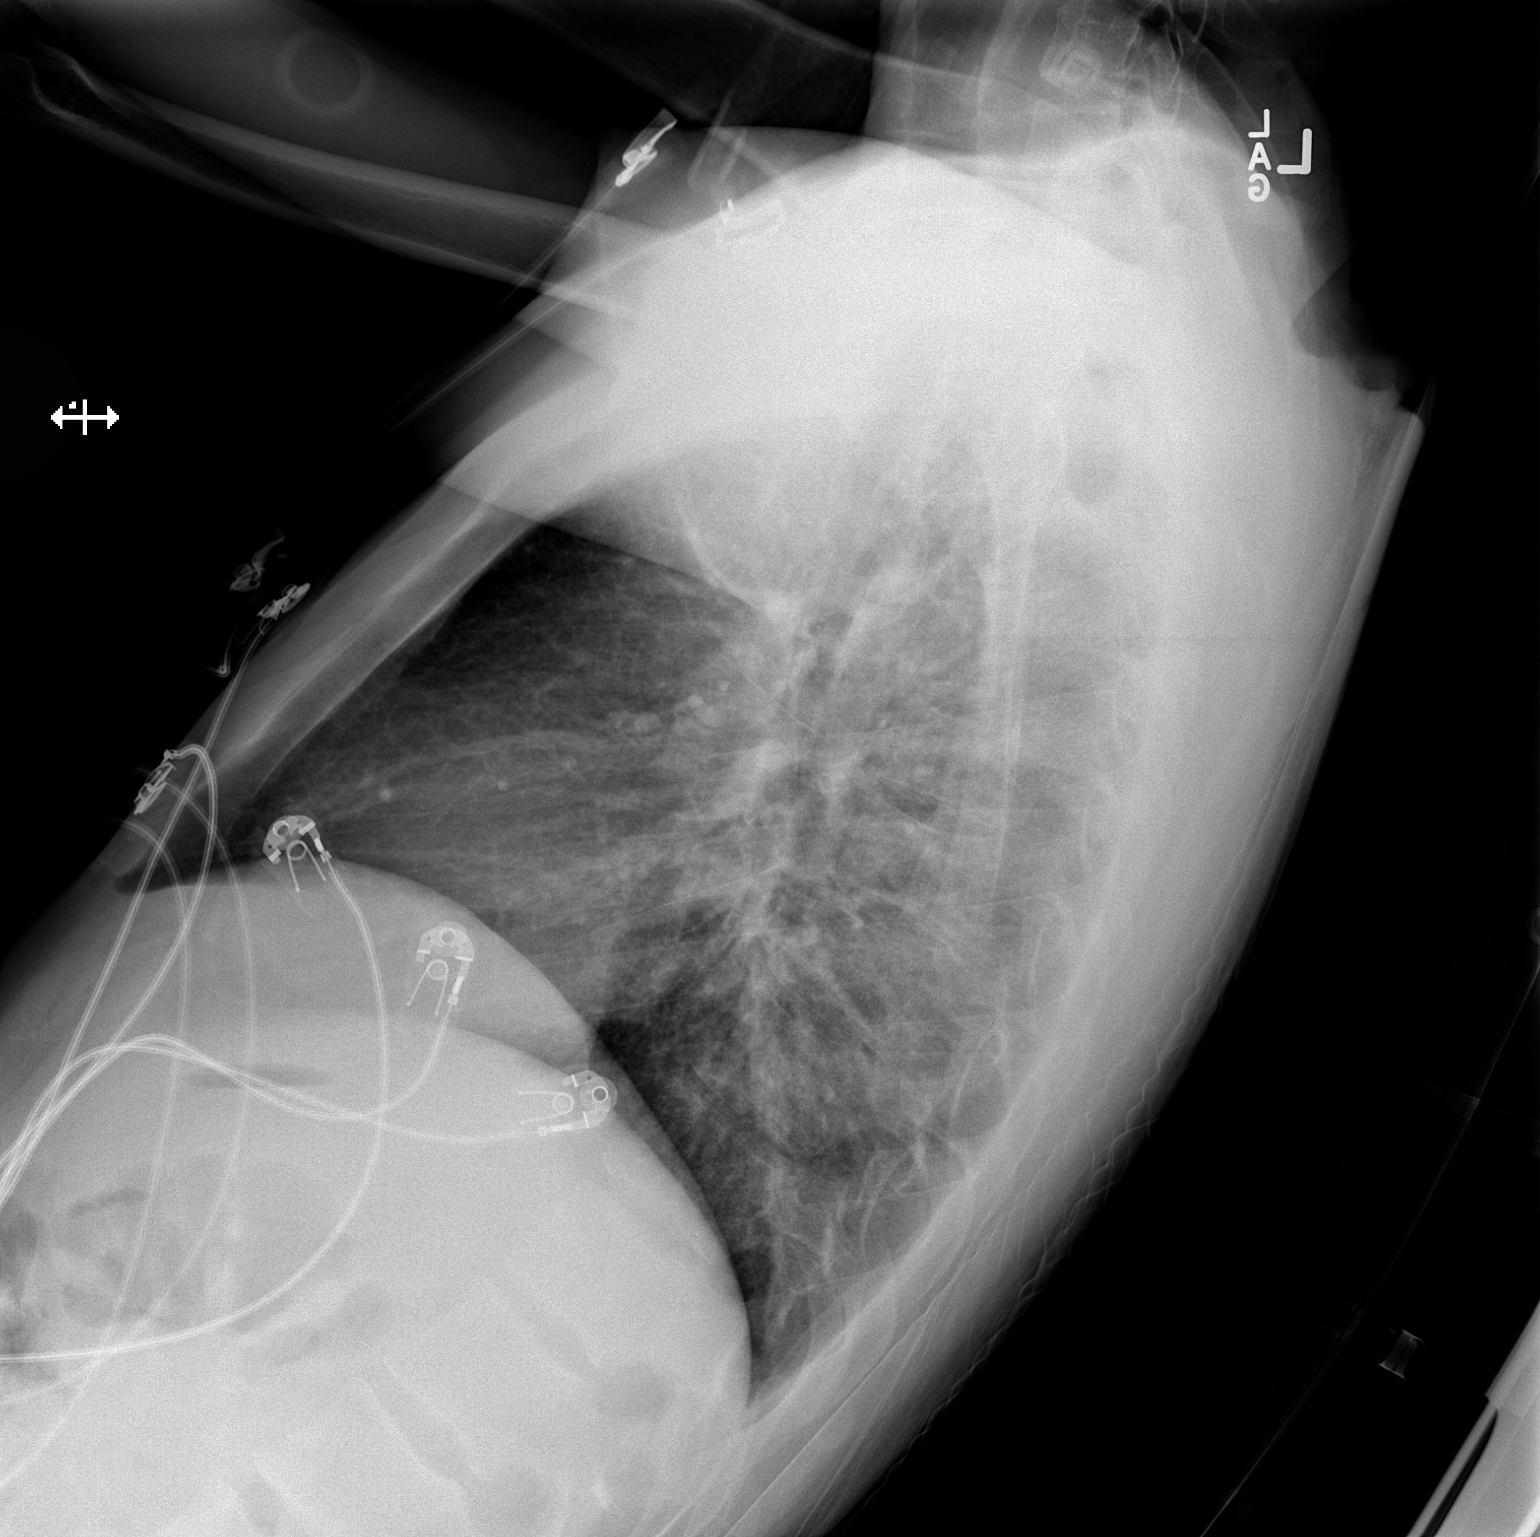

[x chest ap]
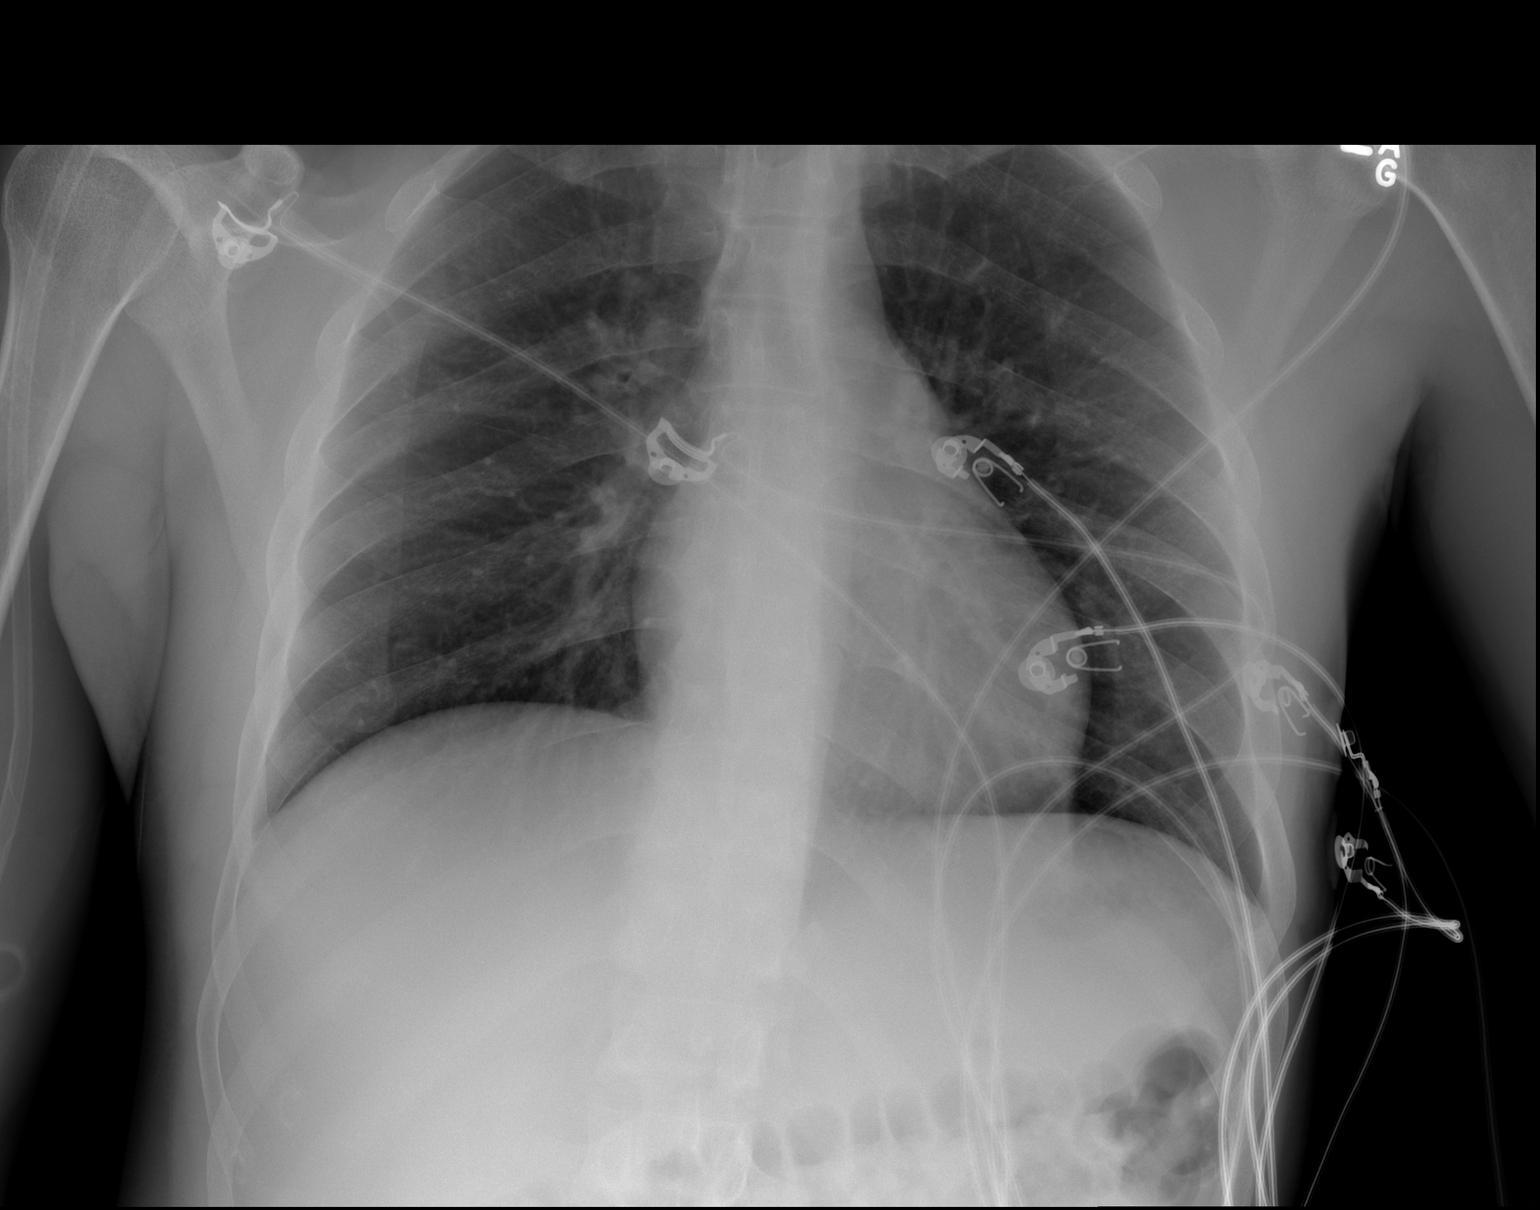

[2 of 2 positions shown; findings below may reference images not displayed]

FINDINGS: The heart size and mediastinal contours are within normal limits.
Both lungs are clear. The visualized skeletal structures are
unremarkable.
IMPRESSION: No active cardiopulmonary disease.

## 2018-07-11 ENCOUNTER — Ambulatory Visit: Payer: Self-pay

## 2018-07-11 ENCOUNTER — Other Ambulatory Visit: Payer: Self-pay | Admitting: Occupational Medicine

## 2018-07-11 DIAGNOSIS — M79641 Pain in right hand: Secondary | ICD-10-CM

## 2018-10-16 IMAGING — DX DG HAND COMPLETE 3+V*R*
3 series · 3 of 3 positions shown · non-contrast
Comparison: None.

CLINICAL DATA: Blunt trauma.  Right hand pain.

EXAM:
RIGHT HAND - COMPLETE 3+ VIEW

[hand pa]
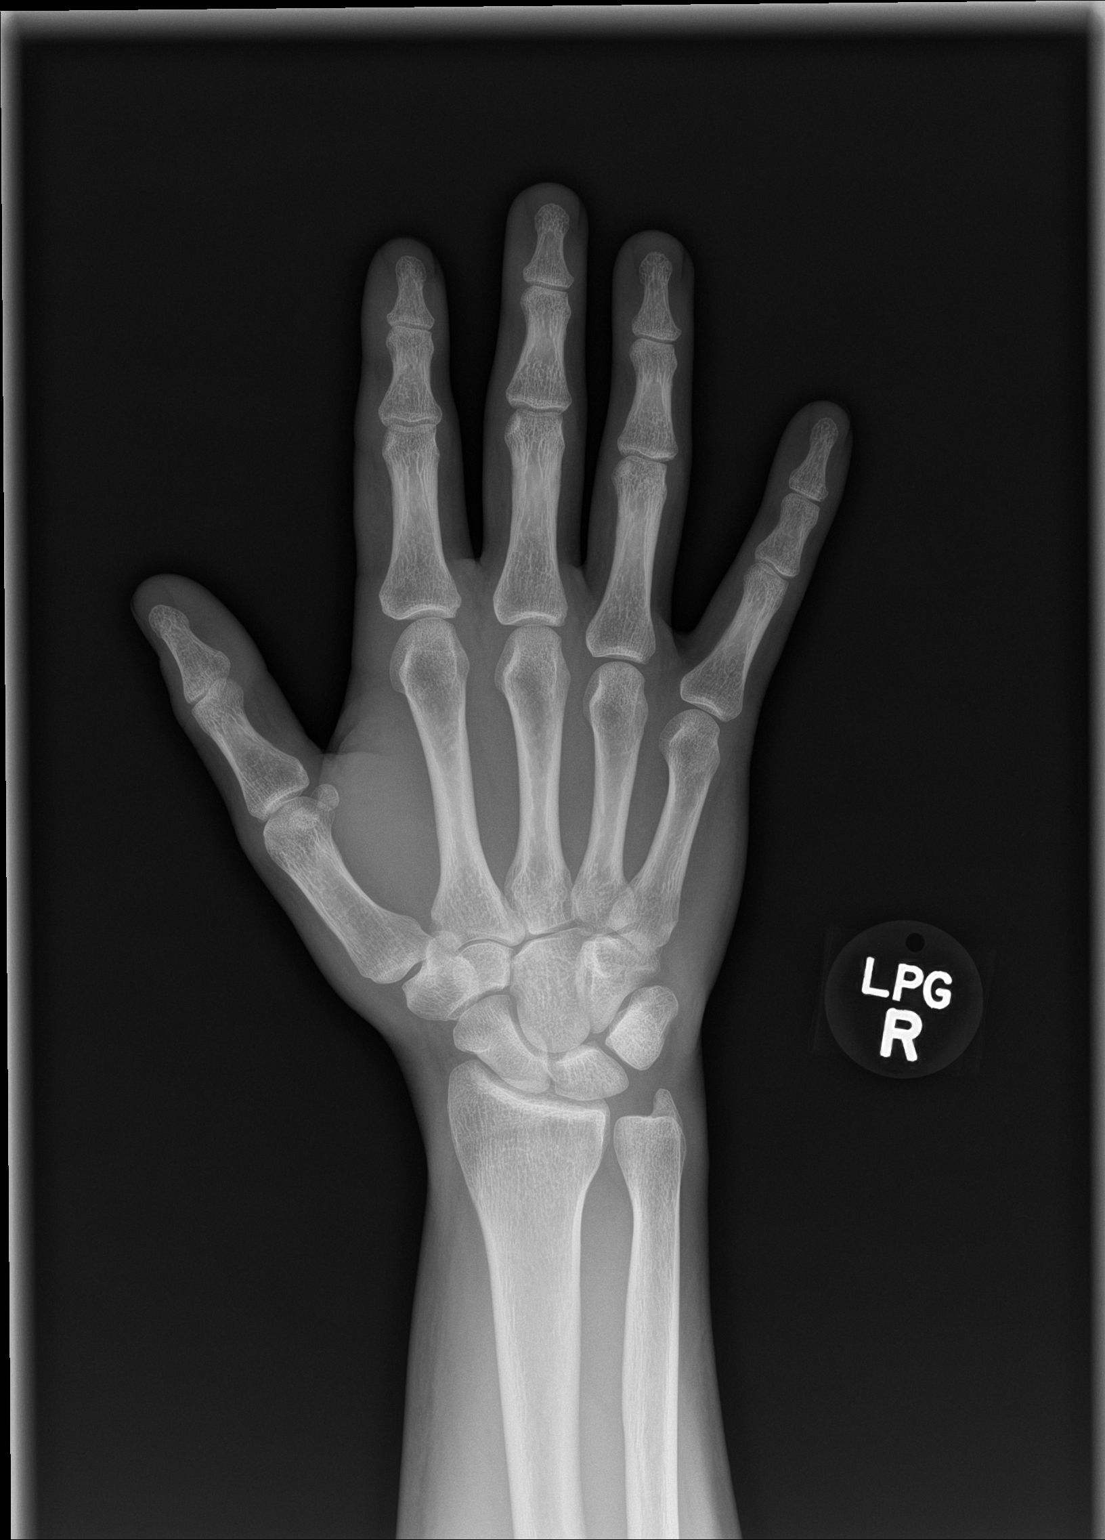

[hand obl]
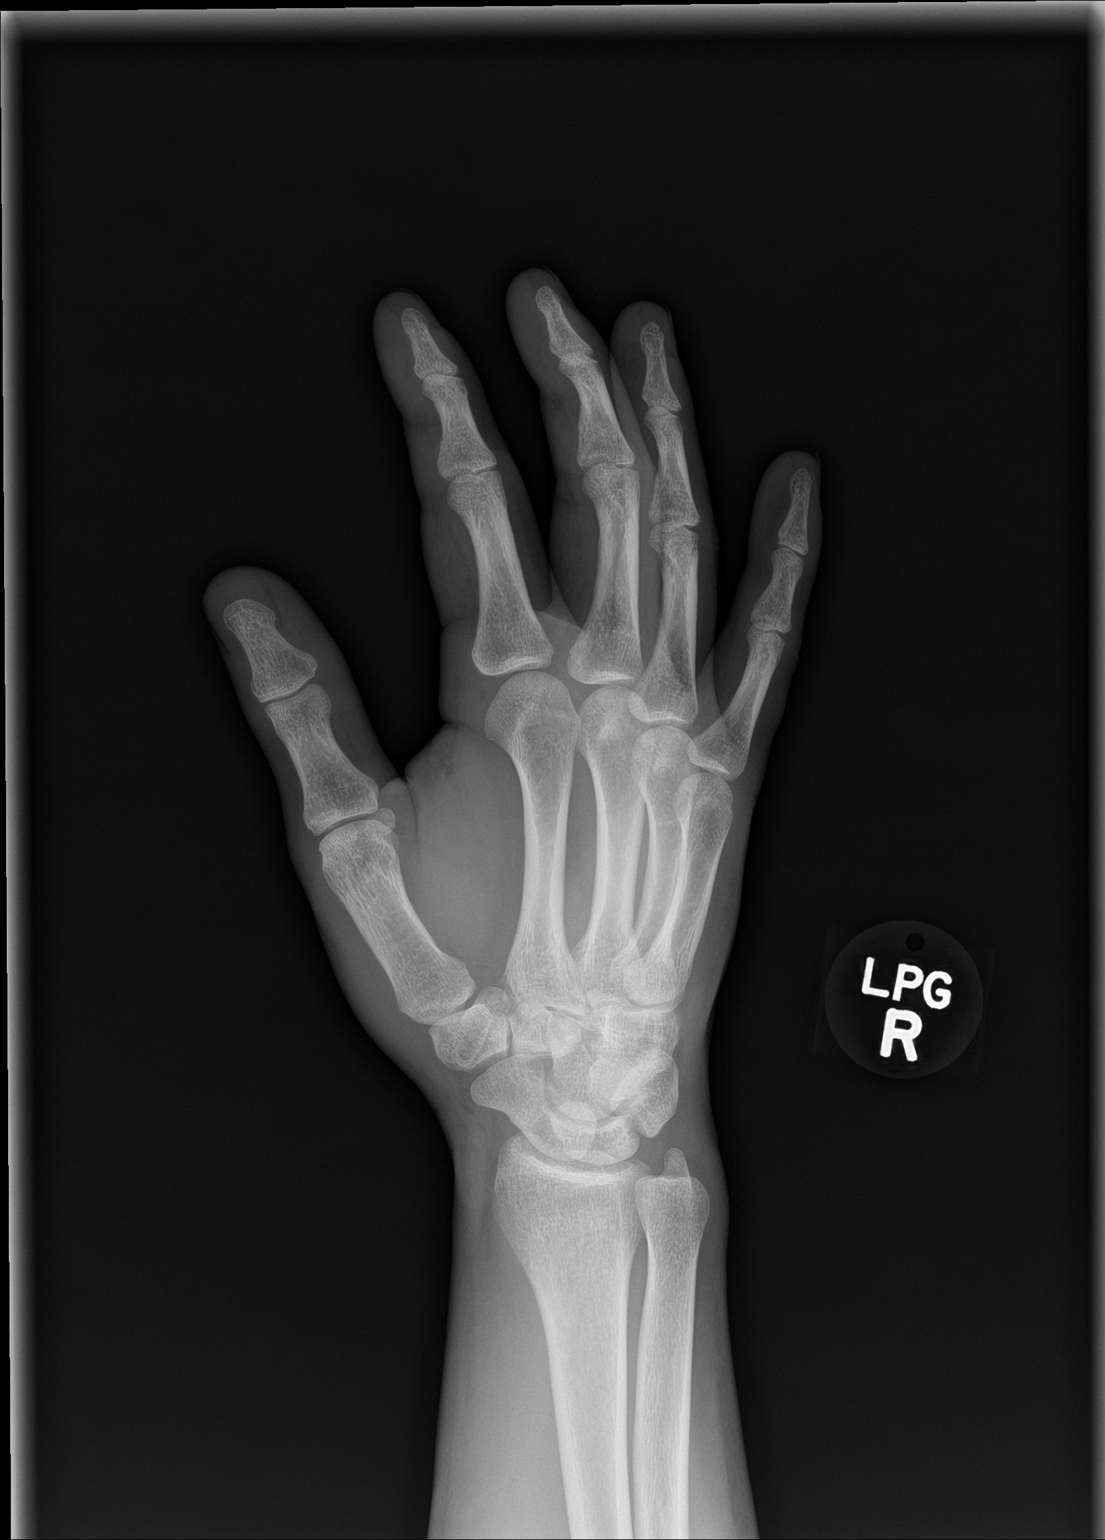

[hand lat]
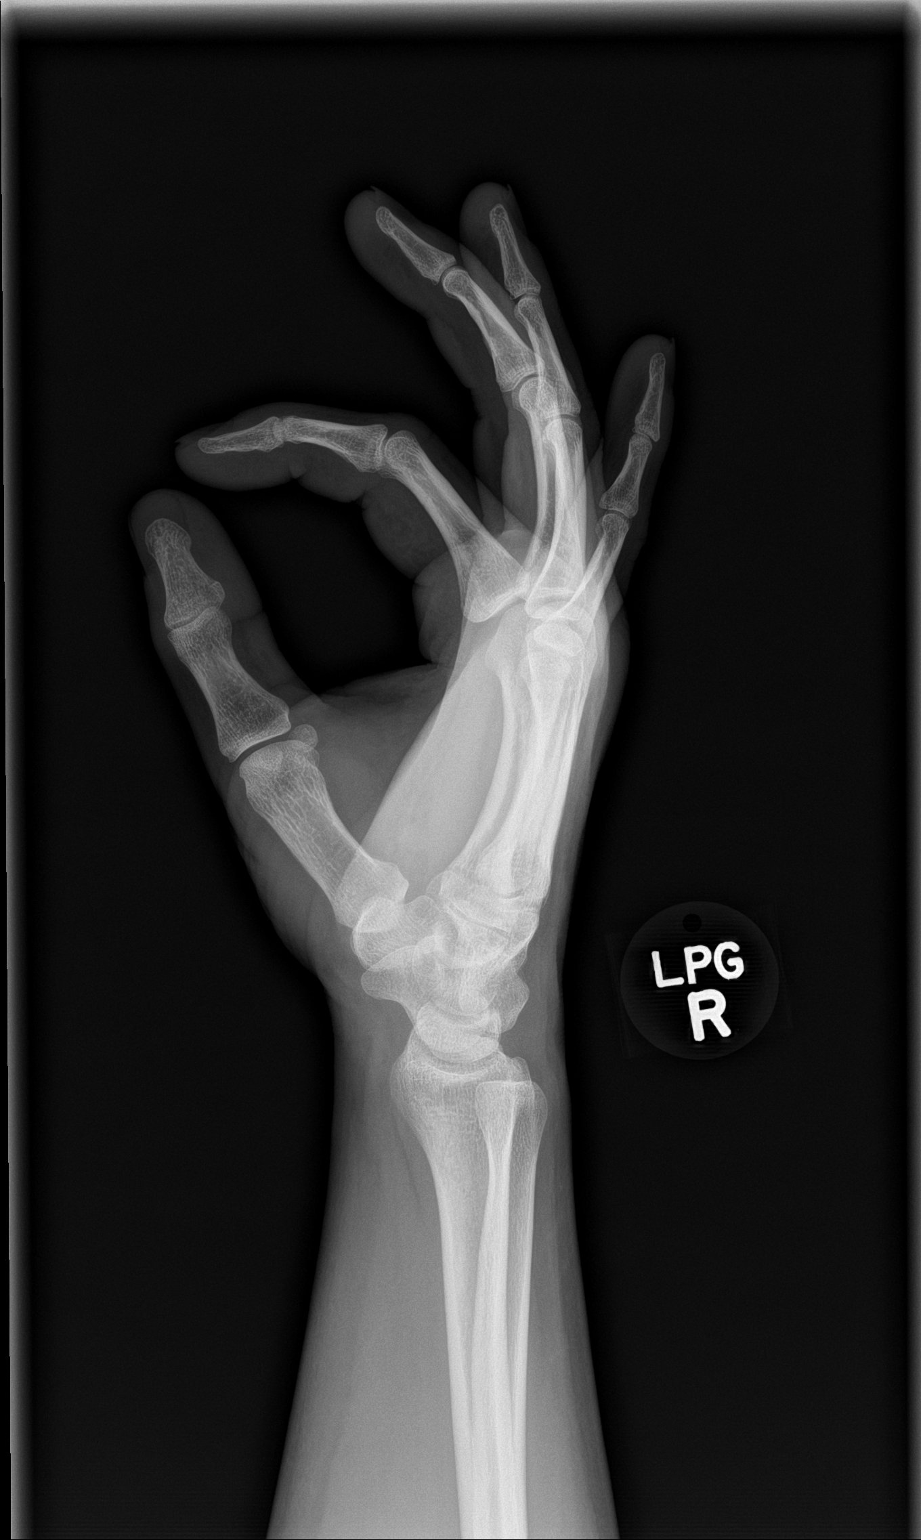

[3 of 3 positions shown; findings below may reference images not displayed]

FINDINGS: There is no evidence of fracture or dislocation. There is no
evidence of arthropathy or other focal bone abnormality. Soft
tissues are unremarkable.
IMPRESSION: No acute osseous injury of the right hand.

## 2022-02-08 ENCOUNTER — Encounter (HOSPITAL_COMMUNITY): Payer: Self-pay

## 2022-02-08 ENCOUNTER — Other Ambulatory Visit: Payer: Self-pay

## 2022-02-08 ENCOUNTER — Emergency Department (HOSPITAL_COMMUNITY)
Admission: EM | Admit: 2022-02-08 | Discharge: 2022-02-08 | Discharge status: Against Medical Advice | Payer: BC Managed Care – PPO | Attending: Emergency Medicine | Admitting: Emergency Medicine

## 2022-02-08 DIAGNOSIS — J45909 Unspecified asthma, uncomplicated: Secondary | ICD-10-CM | POA: Diagnosis not present

## 2022-02-08 DIAGNOSIS — R569 Unspecified convulsions: Secondary | ICD-10-CM | POA: Diagnosis not present

## 2022-02-08 DIAGNOSIS — Z5329 Procedure and treatment not carried out because of patient's decision for other reasons: Secondary | ICD-10-CM | POA: Diagnosis not present

## 2022-02-08 NOTE — ED Provider Notes (Signed)
?[facility] EMERGENCY DEPARTMENT ?Provider Note ? ? ?CSN: [id number] ?Arrival date & time: 02/08/22  0355 ? ?  ? ?History ? ?Chief Complaint  ?Patient presents with  ? Seizures  ? ? ?Zachary Morales is a 36 y.o. male with a history of seizures, anxiety, depression, and asthma who presents to the emergency department via EMS status post seizure shortly prior to arrival.  Patient states hearing worsening with his significant other discussing stressful things in the next he remembers is coming back around confused.  His significant other states that the patient was getting upset and stressed and while he was walking slowly fell to the ground (without head injury) and had a brief seizure for a couple of seconds.  She got her neighbor who is a Archivist who sternal rub the patient, he woke up but did seem confused.  Currently he seems back to baseline.  Patient has no complaints at this time, he states he would like to leave.  He denies headache, change in vision, numbness, weakness, chest pain, abdominal pain, nausea, vomiting, or diarrhea.  He reports that he is prescribed Xanax, has not missed any recent doses.  He does drink alcohol daily usually about 3-4 drinks, he did have a few drinks tonight but no more or less than usual.  He has a history of prior seizures, these used to be stress-induced, he is not currently on antiepileptic medication. ? ?HPI ? ?  ? ?Home Medications ?Prior to Admission medications   ?Medication Sig Start Date End Date Taking? Authorizing Provider  ?albuterol (PROVENTIL HFA;VENTOLIN HFA) 108 (90 BASE) MCG/ACT inhaler Inhale 2 puffs into the lungs every 6 (six) hours as needed for wheezing.     [provider]  ?levETIRAcetam (KEPPRA) 500 MG tablet Take 1 tablet (500 mg total) by mouth 2 (two) times daily. 05/22/15   Ward, Layla Maw, DO  ?naproxen sodium (ANAPROX) 220 MG tablet Take 220 mg by mouth once.    [provider]  ?   ? ?Allergies    ?Patient has no  known allergies.   ? ?Review of Systems   ?Review of Systems  ?Constitutional:  Negative for chills and fever.  ?Eyes:  Negative for visual disturbance.  ?Respiratory:  Negative for shortness of breath.   ?Cardiovascular:  Negative for chest pain.  ?Gastrointestinal:  Negative for abdominal pain, diarrhea, nausea and vomiting.  ?Neurological:  Negative for weakness, numbness and headaches.  ? ?Physical Exam ?Updated Vital Signs ?BP 122/71 (BP Location: Right Arm)   Pulse 80   Temp 97.7 ?F (36.5 ?C) (Oral)   Resp 15   SpO2 97%  ?Physical Exam ?Vitals and nursing note reviewed.  ?Constitutional:   ?   General: He is not in acute distress. ?   Appearance: He is well-developed. He is not toxic-appearing.  ?HENT:  ?   Head: Normocephalic and atraumatic.  ?Eyes:  ?   General:     ?   Right eye: No discharge.     ?   Left eye: No discharge.  ?   Conjunctiva/sclera: Conjunctivae normal.  ?Cardiovascular:  ?   Rate and Rhythm: Normal rate and regular rhythm.  ?Pulmonary:  ?   Effort: No respiratory distress.  ?   Breath sounds: Normal breath sounds. No wheezing or rales.  ?Chest:  ?   Chest wall: No tenderness.  ?Abdominal:  ?   General: There is no distension.  ?   Palpations: Abdomen is soft.  ?  Tenderness: There is no abdominal tenderness.  ?Musculoskeletal:  ?   Cervical back: Neck supple. No tenderness.  ?   Comments: Moving all extremities without focal bony tenderness ?Back: No midline tenderness.  ?Skin: ?   General: Skin is warm and dry.  ?Neurological:  ?   Mental Status: He is alert.  ?   Comments: Clear speech.  CN II to XII grossly intact.  Sensation gross intact bilateral upper and lower extremities.  5 out of 5 symmetric grip strength and strength with plantar dorsiflexion bilaterally.  Patient is ambulatory.  ?Psychiatric:     ?   Behavior: Behavior normal.  ? ? ?ED Results / Procedures / Treatments   ?Labs ?(all labs ordered are listed, but only abnormal results are displayed) ?Labs Reviewed  ?CBC   ?BASIC METABOLIC PANEL  ?CBG MONITORING, ED  ? ? ?EKG ?None ? ?Radiology ?No results found. ? ?Procedures ?Procedures  ? ? ?Medications Ordered in ED ?Medications - No data to display ? ?ED Course/ Medical Decision Making/ A&P ?  ?                        ?Medical Decision Making ?Amount and/or Complexity of Data Reviewed ?Labs: ordered. ? ? ?Patient presents to the emergency department via EMS status post seizure-like activity.  He is nontoxic, resting comfortably, his vitals are within normal limits.  Patient has no complaints at this time, states he would like to go home.  He has no focal neurologic deficits.  No obvious evidence of injury on exam.  We discussed basic labs and EKG with ED monitoring and he was initially agreeable to this. ? ?Chart reviewed for additional history, does have history of seizures ?Per EMS to triage RN normal blood sugar. ? ?Shortly following my initial interaction with the patient I was informed by nursing staff that he was attempting to leave, we discussed recommendation was for him to remain in the emergency department, he continued to refuse and signed out AGAINST MEDICAL ADVICE, risks were discussed.  He ultimately ambulated out of the emergency department with his family members.  I prepared discharge paperwork, advised patient he is not to drive or operate heavy machinery until cleared by neurology, I have sent ambulatory referral to neurology for follow-up. ?Final Clinical Impression(s) / ED Diagnoses ?Final diagnoses:  ?Seizure (HCC)  ? ? ?Rx / DC Orders ?ED Discharge Orders   ? ?      Ordered  ?  Ambulatory referral to Neurology       ?Comments: An appointment is requested in approximately: 1 week  ? 02/08/22 0431  ? ?  ?  ? ?  ? ? ?  ?Cherly Anderson, PA-C ?02/08/22 8101 ? ?  ?Zadie Rhine, MD ?02/08/22 7510 ? ?

## 2022-02-08 NOTE — Discharge Instructions (Addendum)
DO NOT DRIVE OR OPERATE HEAVY MACHINERY UNTIL CLEARED BY NEUROLOGY.  ? ?You are leaving the emergency department AGAINST MEDICAL ADVICE. ? ?Please follow up with neurology/primary care soon as possible.  ? ?Return to the emergency department anytime for new or worsening symptoms or if you wish to have further evaluation.  Return immediately for new or worsening pain, recurrent seizure activity, change in your mental status, trouble walking, passing out, numbness, weakness, or any other concerns. ?

## 2022-02-08 NOTE — ED Triage Notes (Signed)
Pt BIB [facility] from home for seizure like activity. Per family pt has a hx of the same and it is stress related. Pt was arguing with his girlfriend when he laid down and per family went unresponsive for a few minutes and then started convulsing. Pt has Xanax and ETOH on board. Pt is currently A&O x4. Pt did not initially want to come.  ? ? ?113/70 ?70 ?96 ?20 ?103 ?

## 2023-07-28 NOTE — Therapy (Signed)
OUTPATIENT PHYSICAL THERAPY SHOULDER EVALUATION   Patient Name: Zachary Morales MRN: [id number] DOB:1986-10-01, 37 y.o., male Today's Date: 07/31/2023  END OF SESSION:  PT End of Session - 07/31/23 1113     Visit Number 1    Date for PT Re-Evaluation 09/25/23    Authorization Type [employer]    PT Start Time 1110    PT Stop Time 1200    PT Time Calculation (min) 50 min    Activity Tolerance Patient tolerated treatment well    Behavior During Therapy WFL for tasks assessed/performed             Past Medical History:  Diagnosis Date   Anxiety    Asthma    Depression    Seizures (HCC)    Past Surgical History:  Procedure Laterality Date   APPENDECTOMY     Patient Active Problem List   Diagnosis Date Noted   Seizure disorder (HCC) 05/02/2013    PCP: Zachary Plum, MD   REFERRING PROVIDER: Jackie Plum, MD  REFERRING DIAG: M25.511 (ICD-10-CM) - Pain in right shoulder   THERAPY DIAG:  Acute pain of right shoulder  Muscle weakness (generalized)  Cervicalgia  Cramp and spasm  Rationale for Evaluation and Treatment: Rehabilitation  ONSET DATE: 1 month ago  SUBJECTIVE:                                                                                                                                                                                      SUBJECTIVE STATEMENT: I work at [employer], was unloading boxes, a 90 lb box fell and hit R shoulder, if lifts arm past 90 deg has pain in arm and elbow, was put on light duty nothing more than 90 deg, he didn't file workman's comp and dr's note ran out, so he's back to full duty, and so he's back to lifting full weight, so had to take PTO. Even sitting here he still feels pain (indicates UT region).  He was just given muscle relators and light duty and referral to PT.  Hand dominance: Right  PERTINENT HISTORY: Anxiety, depression, seizure disorder, smoker.   PAIN:  Are you having pain? Yes: NPRS scale: 3-4/10 Pain  location: R upper shoulder  Pain description: achey, sharp, burn Aggravating factors: lifting increases pain to 9/10, sleeping on R side makes it worse.   Relieving factors: rest  PRECAUTIONS: None  RED FLAGS: None   WEIGHT BEARING RESTRICTIONS: No  FALLS:  Has patient fallen in last 6 months? 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 2024  OBJECTIVE:   DIAGNOSTIC FINDINGS:  Per patient X-rays taken with no findings.   PATIENT SURVEYS:  Quick Dash 50%  COGNITION: Overall cognitive status: Within functional limits for tasks assessed     SENSATION: Light touch: Impaired  C5 dermatome on R by 20%  POSTURE: Rounded shoulders  CERVICAL ROM:  AROM eval  Cervical Flexion 35  Cervical Extension 32  Cervical Rotation to Left 50  Cervical Rotation to Right 55  Left Sidebend 28  Right Sidebend 30     UPPER EXTREMITY ROM:   Active ROM Right* eval Left eval  Shoulder flexion 110 160  Shoulder extension    Shoulder abduction 92 160  Shoulder adduction    Shoulder internal rotation waist T8  Shoulder external rotation cant T4  (Blank rows = not tested)  UPPER EXTREMITY MMT:  MMT Right* eval Left eval  Shoulder flexion 4+p! 5  Shoulder extension    Shoulder abduction 4+p! 5  Shoulder internal rotation 5 5  Shoulder external rotation 5 5  Elbow flexion 5 5  Elbow extension 5 5  Wrist flexion 5 5  Wrist extension 5 5  Grip strength (lbs) 55, 55, 60  55, 65, 60   (Blank rows = not tested)  SHOULDER SPECIAL TESTS: Impingement tests: Painful arc test: positive    PALPATION:  Tenderness/tightness R cervical paraspinals, upper trap, levator scapulae,  supraspintus, pec minor   TODAY'S TREATMENT:                                                                                                                                         DATE:   07/31/2023  EVAL Self Care: Findings, POC, anatomy of neck and shoulder, Provided letter for work  Therapeutic Exercise: to improve strength and mobility.  Demo, verbal and tactile cues throughout for technique. Initial HEP - levator stretches, upper trap stretch, SCM stretch, chin tucks.    PATIENT EDUCATION: Education details: see self care, HEP Person educated: Patient Education method: Explanation, Demonstration, Verbal cues, and Handouts Education comprehension: verbalized understanding and returned demonstration  HOME EXERCISE PROGRAM: Access Code: [email or web address] URL: [email or web address] Date: 07/31/2023 Prepared by: Zachary Morales  Exercises - Seated Cervical Retraction  - 1 x daily - 7 x weekly - 1 sets - 10 reps - Gentle Levator Scapulae Stretch  - 1 x daily - 7 x weekly - 1 sets - 3 reps - 10 hold - Sternocleidomastoid Stretch  - 1 x daily - 7 x weekly - 1 sets - 3 reps - 10 hold - Seated Gentle Upper Trapezius Stretch  - 1 x daily - 7 x weekly - 1 sets - 3 reps - 10 hold  ASSESSMENT:  CLINICAL IMPRESSION: Patient is a 37 y.o. right  hand dominant male who was seen today for physical therapy evaluation and treatment for R shoulder pain.  He was injured at work when 90lb box fell on top of R shoulder/neck ~ 1 month ago and continues to have pain and spasm with all overhead R arm movements.  He has pain with R shoulder flexion and abduction and is limited with AROM above 90 deg.  Noted cervical AROM is also limited with positive R spurlings.  Noted trigger points in R UT, R levator, and tenderness throughout R shoulder musculature.  Initial HEP given today for gentle stretches for neck and chin tucks, discussed dry needling as he is a good candidate, he is very interested.  Due to  anxiety he would like to be scheduled with same provider only.    Gave him note to for work today so could return to work while undergoing PT and not having to take PTO.  Zachary Morales would benefit from skilled therapy to improve Right shoulder ROM, decrease pain and improve functional mobility and ability to perform ADLs.   OBJECTIVE IMPAIRMENTS: decreased activity tolerance, decreased ROM, decreased strength, impaired perceived functional ability, increased muscle spasms, impaired sensation, impaired UE functional use, and pain.   ACTIVITY LIMITATIONS: carrying, lifting, sleeping, reach over head, and hygiene/grooming  PARTICIPATION LIMITATIONS: driving and occupation  PERSONAL FACTORS: 1-2 comorbidities: Anxiety, depression, seizure disorder, smoker  are also affecting patient's functional outcome.   REHAB POTENTIAL: Good  CLINICAL DECISION MAKING: Stable/uncomplicated  EVALUATION COMPLEXITY: Low   GOALS: Goals reviewed with patient? Yes  SHORT TERM GOALS: Target date: 08/14/2023   Patient will be independent with initial HEP.  Baseline: given Goal status: INITIAL  LONG TERM GOALS: Target date: 09/25/2023   Patient will be independent with advanced/ongoing HEP to improve outcomes and carryover.  Baseline:  Goal status: INITIAL  2.  Patient will report 75% improvement in Right shoulder pain to improve QOL.  Baseline:  Goal status: INITIAL  3.  Patient to improve Right shoulder AROM to WNL without pain provocation to allow for increased ease of ADLs.  Baseline: see objective Goal status: INITIAL  4.  Patient will demonstrate improved functional UE strength as demonstrated by 5/5 R shoulder strength without pain. Baseline: see objective Goal status: INITIAL  5.  Patient will report at least 10 points improvement on QuickDash to demonstrate improved functional ability.  Baseline: 50% Goal status: INITIAL  6.  Patient will be able to lift up to 70lbs without pain to  return to work without restriction.    Baseline: lifting >20lbs increases pain.  Goal status: INITIAL   7. Patient will demonstrate at least 45 deg cervical AROM all directions for safety with driving without pain.  Baseline: see objective Goal status: INITIAL   PLAN:  PT FREQUENCY: 1x/week patient preference due to work  PT DURATION: 8 weeks  PLANNED INTERVENTIONS: Therapeutic exercises, Therapeutic activity, Neuromuscular re-education, Balance training, Gait training, Patient/Family education, Self Care, Joint mobilization, Joint manipulation, Dry Needling, Electrical stimulation, Spinal mobilization, Taping, Traction, Ultrasound, Manual therapy, and Re-evaluation  PLAN FOR NEXT SESSION: review and progress HEP, manual therapy and TrDN   Jena Gauss, PT, DPT 07/31/2023, 12:35 PM

## 2023-07-31 ENCOUNTER — Ambulatory Visit: Payer: BC Managed Care – PPO | Attending: Internal Medicine | Admitting: Physical Therapy

## 2023-07-31 ENCOUNTER — Encounter: Payer: Self-pay | Admitting: Physical Therapy

## 2023-07-31 DIAGNOSIS — M542 Cervicalgia: Secondary | ICD-10-CM | POA: Insufficient documentation

## 2023-07-31 DIAGNOSIS — M25511 Pain in right shoulder: Secondary | ICD-10-CM | POA: Insufficient documentation

## 2023-07-31 DIAGNOSIS — M6281 Muscle weakness (generalized): Secondary | ICD-10-CM | POA: Insufficient documentation

## 2023-07-31 DIAGNOSIS — R252 Cramp and spasm: Secondary | ICD-10-CM | POA: Insufficient documentation

## 2023-08-14 ENCOUNTER — Encounter: Payer: Self-pay | Admitting: Physical Therapy

## 2023-08-14 ENCOUNTER — Ambulatory Visit: Payer: BC Managed Care – PPO | Admitting: Physical Therapy

## 2023-08-14 DIAGNOSIS — R252 Cramp and spasm: Secondary | ICD-10-CM

## 2023-08-14 DIAGNOSIS — M25511 Pain in right shoulder: Secondary | ICD-10-CM | POA: Diagnosis not present

## 2023-08-14 DIAGNOSIS — M6281 Muscle weakness (generalized): Secondary | ICD-10-CM

## 2023-08-14 DIAGNOSIS — M542 Cervicalgia: Secondary | ICD-10-CM

## 2023-08-14 NOTE — Therapy (Signed)
OUTPATIENT PHYSICAL THERAPY TREATMENT   Patient Name: Zachary Morales MRN: [id number] DOB:1986-06-16, 37 y.o., male Today's Date: 08/14/2023  END OF SESSION:  PT End of Session - 08/14/23 1457     Visit Number 2    Date for PT Re-Evaluation 09/25/23    Authorization Type [employer]BS    PT Start Time 1455    PT Stop Time 1535    PT Time Calculation (min) 40 min    Activity Tolerance Patient tolerated treatment well    Behavior During Therapy WFL for tasks assessed/performed             Past Medical History:  Diagnosis Date   Anxiety    Asthma    Depression    Seizures (HCC)    Past Surgical History:  Procedure Laterality Date   APPENDECTOMY     Patient Active Problem List   Diagnosis Date Noted   Seizure disorder (HCC) 05/02/2013    PCP: Jackie Plum, MD   REFERRING PROVIDER: Jackie Plum, MD  REFERRING DIAG: M25.511 (ICD-10-CM) - Pain in right shoulder   THERAPY DIAG:  Acute pain of right shoulder  Muscle weakness (generalized)  Cervicalgia  Cramp and spasm  Rationale for Evaluation and Treatment: Rehabilitation  ONSET DATE: 1 month ago  SUBJECTIVE:                                                                                                                                                                                      SUBJECTIVE STATEMENT: "Making it" continues to have R shoulder and neck pain, also has tooth ache today which isn't helping.  Hand dominance: Right  PERTINENT HISTORY: Anxiety, depression, seizure disorder, smoker.   PAIN:  Are you having pain? Yes: NPRS scale: 5/10 Pain location: R upper shoulder  Pain description: achey, sharp, burn Aggravating factors: lifting increases pain to 9/10, sleeping on R side makes it worse.   Relieving factors: rest  PRECAUTIONS: None  RED FLAGS: None   WEIGHT BEARING RESTRICTIONS: No  FALLS:  Has patient fallen in last 6 months? 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 2024  OBJECTIVE:   DIAGNOSTIC FINDINGS:  Per patient X-rays taken with no findings.   PATIENT SURVEYS:  Quick Dash 50%  COGNITION: Overall cognitive status: Within functional limits for tasks assessed     SENSATION: Light touch: Impaired  C5 dermatome on R  by 20%  POSTURE: Rounded shoulders  CERVICAL ROM:  AROM eval  Cervical Flexion 35  Cervical Extension 32  Cervical Rotation to Left 50  Cervical Rotation to Right 55  Left Sidebend 28  Right Sidebend 30     UPPER EXTREMITY ROM:   Active ROM Right* eval Left eval  Shoulder flexion 110 160  Shoulder extension    Shoulder abduction 92 160  Shoulder adduction    Shoulder internal rotation waist T8  Shoulder external rotation cant T4  (Blank rows = not tested)  UPPER EXTREMITY MMT:  MMT Right* eval Left eval  Shoulder flexion 4+p! 5  Shoulder extension    Shoulder abduction 4+p! 5  Shoulder internal rotation 5 5  Shoulder external rotation 5 5  Elbow flexion 5 5  Elbow extension 5 5  Wrist flexion 5 5  Wrist extension 5 5  Grip strength (lbs) 55, 55, 60  55, 65, 60   (Blank rows = not tested)  SHOULDER SPECIAL TESTS: Impingement tests: Painful arc test: positive    PALPATION:  Tenderness/tightness R cervical paraspinals, upper trap, levator scapulae, supraspintus, pec minor   TODAY'S TREATMENT:                                                                                                                                         DATE:   08/14/23 Therapeutic Exercise: to improve strength and mobility.  Demo, verbal and tactile cues throughout for technique.  UBE x 2 minA AAROM forward flexion  Swiffer x 20  AAROM scaption swiffer x 20  AAROM shoulder external rotation with cane x 10 AAROM shoulder abduction with cane x 10  Review HEP.  Manual Therapy: to decrease muscle spasm and pain and improve mobility STM/TPR to cervical paraspinals, R UT/LS, R UPA mobs cervical spine grade 1-2, skilled palpation and monitoring during dry needling. Trigger Point Dry-Needling  Treatment instructions: Expect mild to moderate muscle soreness. S/S of pneumothorax if dry needled over a lung field, and to seek immediate medical attention should they occur. Patient verbalized understanding of these instructions and education. Patient Consent Given: Yes Education handout provided: Yes Muscles treated: R cervical multifidi C3-6, R UT, R L/S Electrical stimulation performed: No Parameters: N/A Treatment response/outcome: Twitch Response Elicited and Palpable Increase in Muscle Length  07/31/2023  EVAL Self Care: Findings, POC, anatomy of neck and shoulder, Provided letter for work  Therapeutic Exercise: to improve strength and mobility.  Demo, verbal and tactile cues throughout for technique. Initial HEP - levator stretches, upper trap stretch, SCM stretch, chin tucks.    PATIENT EDUCATION: Education details: HEP update Person educated: Patient Education method: Explanation, Demonstration, Verbal cues, and Handouts Education comprehension: verbalized understanding and returned demonstration  HOME EXERCISE PROGRAM: Access Code: [email or web address] URL: [email or web address] Date: 08/14/2023 Prepared by: Harrie Foreman  Exercises - Seated Cervical Retraction  - 1 x daily - 7 x weekly -  1 sets - 10 reps - Gentle Levator Scapulae Stretch  - 1 x daily - 7 x weekly - 1 sets - 3 reps - 10 hold - Sternocleidomastoid Stretch  - 1 x daily - 7 x weekly - 1 sets - 3 reps - 10 hold - Seated Gentle Upper Trapezius Stretch  - 1 x daily - 7 x weekly - 1 sets - 3 reps - 10 hold - Standing Shoulder  Abduction AAROM with Dowel  - 2 x daily - 7 x weekly - 1 sets - 10 reps - Seated Shoulder Flexion Extension AAROM with Dowel into Wall  - 2 x daily - 7 x weekly - 1 sets - 10 reps - Seated Shoulder Circles AAROM with Dowel into Wall  - 2 x daily - 7 x weekly - 1 sets - 10 reps - Standing Shoulder External Rotation AAROM with Dowel  - 2 x daily - 7 x weekly - 1 sets - 10 reps  ASSESSMENT:  CLINICAL IMPRESSION: MARYANN CEBALLO returned today for 1st follow-up after IE, he reports continued neck and R shoulder pain, but has been performing initial HEP for neck, meeting STG #1.  Did not tolerated UBE today, but did well with "swiffer"  AAROM exercises for R shoulder, added to HEP.  After explanation of DN rational, procedures, outcomes and potential side effects, patient verbalized consent to DN treatment in conjunction with manual STM/DTM and TPR to reduce ttp/muscle tension. Muscles treated as indicated above. DN produced normal response with good twitches elicited resulting in palpable reduction in pain/ttp and muscle tension, with patient noting less pain upon initiation of movement following DN. Pt educated to expect mild to moderate muscle soreness for up to 24-48 hrs and instructed to continue prescribed home exercise program and current activity level with pt verbalizing understanding of theses instructions. GRALIN SHIPES continues to demonstrate potential for improvement and would benefit from continued skilled therapy to address impairments.      OBJECTIVE IMPAIRMENTS: decreased activity tolerance, decreased ROM, decreased strength, impaired perceived functional ability, increased muscle spasms, impaired sensation, impaired UE functional use, and pain.   ACTIVITY LIMITATIONS: carrying, lifting, sleeping, reach over head, and hygiene/grooming  PARTICIPATION LIMITATIONS: driving and occupation  PERSONAL FACTORS: 1-2 comorbidities: Anxiety, depression, seizure disorder, smoker  are also affecting  patient's functional outcome.   REHAB POTENTIAL: Good  CLINICAL DECISION MAKING: Stable/uncomplicated  EVALUATION COMPLEXITY: Low   GOALS: Goals reviewed with patient? Yes  SHORT TERM GOALS: Target date: 08/14/2023   Patient will be independent with initial HEP.  Baseline: given Goal status: MET 08/14/23  LONG TERM GOALS: Target date: 09/25/2023   Patient will be independent with advanced/ongoing HEP to improve outcomes and carryover.  Baseline:  Goal status: IN PROGRESS  2.  Patient will report 75% improvement in Right shoulder pain to improve QOL.  Baseline:  Goal status: IN PROGRESS  3.  Patient to improve Right shoulder AROM to WNL without pain provocation to allow for increased ease of ADLs.  Baseline: see objective Goal status: IN PROGRESS  4.  Patient will demonstrate improved functional UE strength as demonstrated by 5/5 R shoulder strength without pain. Baseline: see objective Goal status: IN PROGRESS  5.  Patient will report at least 10 points improvement on QuickDash to demonstrate improved functional ability.  Baseline: 50% Goal status: IN PROGRESS  6.  Patient will be able to lift up to 70lbs without pain to return to work without restriction.    Baseline:  lifting >20lbs increases pain.  Goal status: IN PROGRESS   7. Patient will demonstrate at least 45 deg cervical AROM all directions for safety with driving without pain.  Baseline: see objective Goal status: IN PROGRESS   PLAN:  PT FREQUENCY: 1x/week patient preference due to work  PT DURATION: 8 weeks  PLANNED INTERVENTIONS: Therapeutic exercises, Therapeutic activity, Neuromuscular re-education, Balance training, Gait training, Patient/Family education, Self Care, Joint mobilization, Joint manipulation, Dry Needling, Electrical stimulation, Spinal mobilization, Taping, Traction, Ultrasound, Manual therapy, and Re-evaluation  PLAN FOR NEXT SESSION: review and progress HEP, manual therapy and  TrDN   Jena Gauss, PT, DPT 08/14/2023, 4:44 PM

## 2023-08-21 ENCOUNTER — Ambulatory Visit: Payer: BC Managed Care – PPO | Admitting: Physical Therapy

## 2023-08-21 ENCOUNTER — Encounter: Payer: Self-pay | Admitting: Physical Therapy

## 2023-08-21 DIAGNOSIS — R252 Cramp and spasm: Secondary | ICD-10-CM

## 2023-08-21 DIAGNOSIS — M6281 Muscle weakness (generalized): Secondary | ICD-10-CM

## 2023-08-21 DIAGNOSIS — M542 Cervicalgia: Secondary | ICD-10-CM

## 2023-08-21 DIAGNOSIS — M25511 Pain in right shoulder: Secondary | ICD-10-CM

## 2023-08-21 NOTE — Therapy (Signed)
OUTPATIENT PHYSICAL THERAPY TREATMENT   Patient Name: Zachary Morales MRN: [id number] DOB:[birth year], 37 y.o., male Today's Date: 08/21/2023  END OF SESSION:  PT End of Session - 08/21/23 1455     Visit Number 3    Date for PT Re-Evaluation 09/25/23    Authorization Type [employer]    PT Start Time 1455    PT Stop Time 1530    PT Time Calculation (min) 35 min    Activity Tolerance Patient tolerated treatment well    Behavior During Therapy WFL for tasks assessed/performed             Past Medical History:  Diagnosis Date   Anxiety    Asthma    Depression    Seizures (HCC)    Past Surgical History:  Procedure Laterality Date   APPENDECTOMY     Patient Active Problem List   Diagnosis Date Noted   Seizure disorder (HCC) 05/02/2013    PCP: Jackie Plum, MD   REFERRING PROVIDER: Jackie Plum, MD  REFERRING DIAG: M25.511 (ICD-10-CM) - Pain in right shoulder   THERAPY DIAG:  Acute pain of right shoulder  Muscle weakness (generalized)  Cervicalgia  Cramp and spasm  Rationale for Evaluation and Treatment: Rehabilitation  ONSET DATE: 1 month ago  SUBJECTIVE:                                                                                                                                                                                      SUBJECTIVE STATEMENT: R shoulder is feeling better, neck is little achey today.  Felt good after TrDN.  Hand dominance: Right  PERTINENT HISTORY: Anxiety, depression, seizure disorder, smoker.   PAIN:  Are you having pain? Yes: NPRS scale: 4-5/10 Pain location: R upper shoulder  Pain description: achey, sharp, burn Aggravating factors: lifting increases pain to 9/10, sleeping on R side makes it worse.   Relieving factors: rest  PRECAUTIONS: None  RED FLAGS: None   WEIGHT BEARING RESTRICTIONS: No  FALLS:  Has patient fallen in last 6 months? 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 2024  OBJECTIVE:   DIAGNOSTIC FINDINGS:  Per patient X-rays taken with no findings.   PATIENT SURVEYS:  Quick Dash 50%  COGNITION: Overall cognitive status: Within functional limits for tasks assessed     SENSATION: Light touch: Impaired  C5 dermatome on R by 20%  POSTURE: Rounded shoulders  CERVICAL ROM:  AROM eval  Cervical Flexion 35  Cervical Extension 32  Cervical Rotation to Left 50  Cervical Rotation to Right 55  Left Sidebend 28  Right Sidebend 30     UPPER EXTREMITY ROM:   Active ROM Right* eval Left eval  Shoulder flexion 110 160  Shoulder extension    Shoulder abduction 92 160  Shoulder adduction    Shoulder internal rotation waist T8  Shoulder external rotation cant T4  (Blank rows = not tested)  UPPER EXTREMITY MMT:  MMT Right* eval Left eval  Shoulder flexion 4+p! 5  Shoulder extension    Shoulder abduction 4+p! 5  Shoulder internal rotation 5 5  Shoulder external rotation 5 5  Elbow flexion 5 5  Elbow extension 5 5  Wrist flexion 5 5  Wrist extension 5 5  Grip strength (lbs) 55, 55, 60  55, 65, 60   (Blank rows = not tested)  SHOULDER SPECIAL TESTS: Impingement tests: Painful arc test: positive    PALPATION:  Tenderness/tightness R cervical paraspinals, upper trap, levator scapulae, supraspintus, pec minor   TODAY'S TREATMENT:                                                                                                                                         DATE:   08/21/23 Therapeutic Exercise: to improve strength and mobility.  Demo, verbal and tactile cues throughout for technique. UBE L1 x 4 min forward Prone flexion 2 x  10 Prone extension 2 x 10  Prone scaption 2 x 10  Prone Rows 2 x 10  Standing wall slide flexion x 10  Manual Therapy: to decrease muscle spasm and pain and improve mobility STM/TPR to cervical paraspinals, R UT/LS, R rhomboids, PA mobs thoracic spine, R scapular mobs, skilled palpation and monitoring during dry needling. Trigger Point Dry-Needling  Treatment instructions: Expect mild to moderate muscle soreness. S/S of pneumothorax if dry needled over a lung field, and to seek immediate medical attention should they occur. Patient verbalized understanding of these instructions and education. Patient Consent Given: Yes Education handout provided: Yes Muscles treated:  R UT, R L/S Electrical stimulation performed: No Parameters: N/A Treatment response/outcome: Twitch Response Elicited and Palpable Increase in Muscle Length   08/14/23 Therapeutic Exercise: to improve strength and mobility.  Demo, verbal and tactile cues throughout for technique.  UBE x 2 minA AAROM forward flexion Swiffer x 20  AAROM scaption swiffer x 20  AAROM shoulder external rotation with cane x 10 AAROM shoulder abduction with cane x 10  Review HEP.  Manual Therapy: to decrease muscle spasm and pain and improve mobility STM/TPR to cervical paraspinals, R UT/LS, R UPA mobs cervical spine grade 1-2, skilled palpation and monitoring during dry needling. Trigger Point Dry-Needling  Treatment instructions: Expect mild to moderate muscle soreness. S/S of pneumothorax if dry needled over a lung  field, and to seek immediate medical attention should they occur. Patient verbalized understanding of these instructions and education. Patient Consent Given: Yes Education handout provided: Yes Muscles treated: R cervical multifidi C3-6, R UT, R L/S Electrical stimulation performed: No Parameters: N/A Treatment response/outcome: Twitch Response Elicited and Palpable Increase in Muscle Length  PATIENT EDUCATION: Education  details: HEP update Person educated: Patient Education method: Explanation, Demonstration, Verbal cues, and Handouts Education comprehension: verbalized understanding and returned demonstration  HOME EXERCISE PROGRAM: Access Code: [email or web address] URL: [email or web address] Date: 08/21/2023 Prepared by: Harrie Foreman  Exercises - Seated Cervical Retraction  - 1 x daily - 7 x weekly - 1 sets - 10 reps - Gentle Levator Scapulae Stretch  - 1 x daily - 7 x weekly - 1 sets - 3 reps - 10 hold - Sternocleidomastoid Stretch  - 1 x daily - 7 x weekly - 1 sets - 3 reps - 10 hold - Seated Gentle Upper Trapezius Stretch  - 1 x daily - 7 x weekly - 1 sets - 3 reps - 10 hold - Standing Shoulder Abduction AAROM with Dowel  - 2 x daily - 7 x weekly - 1 sets - 10 reps - Seated Shoulder Flexion Extension AAROM with Dowel into Wall  - 2 x daily - 7 x weekly - 1 sets - 10 reps - Seated Shoulder Circles AAROM with Dowel into Wall  - 2 x daily - 7 x weekly - 1 sets - 10 reps - Standing Shoulder External Rotation AAROM with Dowel  - 2 x daily - 7 x weekly - 1 sets - 10 reps - Prone Shoulder Flexion  - 1 x daily - 7 x weekly - 3 sets - 10 reps - Prone Single Arm Shoulder Y  - 1 x daily - 7 x weekly - 3 sets - 10 reps - Prone Single Arm Shoulder Extension  - 1 x daily - 7 x weekly - 3 sets - 10 reps - Prone Shoulder Row  - 1 x daily - 7 x weekly - 3 sets - 10 reps  ASSESSMENT:  CLINICAL IMPRESSION: Zachary Morales reports good compliance with HEP and improvement with pain, still having pain more in R scapular region.  Today progressed HEP focusing on posterior shoulder strengthening in prone to avoid activating UT/LS, tolerated well without pain, but rapid fatigue.  Manual therapy focused on R shoulder to improve mobility.   Zachary Morales continues to demonstrate potential for improvement and would benefit from continued skilled therapy to address impairments.      OBJECTIVE IMPAIRMENTS: decreased  activity tolerance, decreased ROM, decreased strength, impaired perceived functional ability, increased muscle spasms, impaired sensation, impaired UE functional use, and pain.   ACTIVITY LIMITATIONS: carrying, lifting, sleeping, reach over head, and hygiene/grooming  PARTICIPATION LIMITATIONS: driving and occupation  PERSONAL FACTORS: 1-2 comorbidities: Anxiety, depression, seizure disorder, smoker  are also affecting patient's functional outcome.   REHAB POTENTIAL: Good  CLINICAL DECISION MAKING: Stable/uncomplicated  EVALUATION COMPLEXITY: Low   GOALS: Goals reviewed with patient? Yes  SHORT TERM GOALS: Target date: 08/14/2023   Patient will be independent with initial HEP.  Baseline: given Goal status: MET 08/14/23  LONG TERM GOALS: Target date: 09/25/2023   Patient will be independent with advanced/ongoing HEP to improve outcomes and carryover.  Baseline:  Goal status: IN PROGRESS  2.  Patient will report 75% improvement in Right shoulder pain to improve QOL.  Baseline:  Goal status: IN PROGRESS  3.  Patient to  improve Right shoulder AROM to WNL without pain provocation to allow for increased ease of ADLs.  Baseline: see objective Goal status: IN PROGRESS  4.  Patient will demonstrate improved functional UE strength as demonstrated by 5/5 R shoulder strength without pain. Baseline: see objective Goal status: IN PROGRESS  5.  Patient will report at least 10 points improvement on QuickDash to demonstrate improved functional ability.  Baseline: 50% Goal status: IN PROGRESS  6.  Patient will be able to lift up to 70lbs without pain to return to work without restriction.    Baseline: lifting >20lbs increases pain.  Goal status: IN PROGRESS   7. Patient will demonstrate at least 45 deg cervical AROM all directions for safety with driving without pain.  Baseline: see objective Goal status: IN PROGRESS   PLAN:  PT FREQUENCY: 1x/week patient preference due to  work  PT DURATION: 8 weeks  PLANNED INTERVENTIONS: Therapeutic exercises, Therapeutic activity, Neuromuscular re-education, Balance training, Gait training, Patient/Family education, Self Care, Joint mobilization, Joint manipulation, Dry Needling, Electrical stimulation, Spinal mobilization, Taping, Traction, Ultrasound, Manual therapy, and Re-evaluation  PLAN FOR NEXT SESSION: review and progress HEP, manual therapy and TrDN   Jena Gauss, PT, DPT 08/21/2023, 5:37 PM

## 2023-08-28 ENCOUNTER — Ambulatory Visit: Payer: BC Managed Care – PPO | Admitting: Physical Therapy

## 2023-09-04 ENCOUNTER — Ambulatory Visit: Payer: BC Managed Care – PPO

## 2023-09-11 ENCOUNTER — Encounter: Payer: Self-pay | Admitting: Physical Therapy

## 2023-09-11 ENCOUNTER — Ambulatory Visit: Payer: BC Managed Care – PPO | Attending: Internal Medicine | Admitting: Physical Therapy

## 2023-09-11 DIAGNOSIS — R252 Cramp and spasm: Secondary | ICD-10-CM | POA: Insufficient documentation

## 2023-09-11 DIAGNOSIS — M6281 Muscle weakness (generalized): Secondary | ICD-10-CM | POA: Insufficient documentation

## 2023-09-11 DIAGNOSIS — M25511 Pain in right shoulder: Secondary | ICD-10-CM | POA: Diagnosis present

## 2023-09-11 DIAGNOSIS — M542 Cervicalgia: Secondary | ICD-10-CM | POA: Insufficient documentation

## 2023-09-11 NOTE — Therapy (Signed)
OUTPATIENT PHYSICAL THERAPY TREATMENT   Patient Name: Zachary Morales MRN: [id number] DOB:02-Apr-1986, 37 y.o., male Today's Date: 09/11/2023  END OF SESSION:  PT End of Session - 09/11/23 1535     Visit Number 4    Date for PT Re-Evaluation 10/23/23    Authorization Type [employer]    PT Start Time 1534    PT Stop Time 1630    PT Time Calculation (min) 56 min    Activity Tolerance Patient tolerated treatment well    Behavior During Therapy WFL for tasks assessed/performed             Past Medical History:  Diagnosis Date   Anxiety    Asthma    Depression    Seizures (HCC)    Past Surgical History:  Procedure Laterality Date   APPENDECTOMY     Patient Active Problem List   Diagnosis Date Noted   Seizure disorder (HCC) 05/02/2013    PCP: Jackie Plum, MD   REFERRING PROVIDER: Jackie Plum, MD  REFERRING DIAG: M25.511 (ICD-10-CM) - Pain in right shoulder   THERAPY DIAG:  Acute pain of right shoulder  Muscle weakness (generalized)  Cervicalgia  Cramp and spasm  Rationale for Evaluation and Treatment: Rehabilitation  ONSET DATE: 1 month ago  SUBJECTIVE:                                                                                                                                                                                      SUBJECTIVE STATEMENT: Had to work last night since letter ran out, very painful today.  Hand dominance: Right  PERTINENT HISTORY: Anxiety, depression, seizure disorder, smoker.   PAIN:  Are you having pain? Yes: NPRS scale: 7-8/10 Pain location: R upper shoulder  Pain description: achey, sharp, burn Aggravating factors: lifting increases pain to 9/10, sleeping on R side makes it worse.   Relieving factors: rest  PRECAUTIONS: None  RED FLAGS: None   WEIGHT BEARING RESTRICTIONS: No  FALLS:  Has patient fallen in last 6 months? 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 2024  OBJECTIVE:   DIAGNOSTIC FINDINGS:  Per patient X-rays taken with no findings.   PATIENT SURVEYS:  Quick Dash 50%  COGNITION: Overall cognitive status: Within functional limits for tasks assessed     SENSATION: Light touch: Impaired  C5 dermatome on R by 20%  POSTURE: Rounded shoulders  CERVICAL ROM:  AROM eval  Cervical Flexion 35  Cervical Extension 32  Cervical Rotation to Left 50  Cervical Rotation to Right 55  Left Sidebend 28  Right Sidebend 30     UPPER EXTREMITY ROM:   Active ROM Right* eval Left eval  Shoulder flexion 110 160  Shoulder extension    Shoulder abduction 92 160  Shoulder adduction    Shoulder internal rotation waist T8  Shoulder external rotation cant T4  (Blank rows = not tested)  UPPER EXTREMITY MMT:  MMT Right* eval Left eval  Shoulder flexion 4+p! 5  Shoulder extension    Shoulder abduction 4+p! 5  Shoulder internal rotation 5 5  Shoulder external rotation 5 5  Elbow flexion 5 5  Elbow extension 5 5  Wrist flexion 5 5  Wrist extension 5 5  Grip strength (lbs) 55, 55, 60  55, 65, 60   (Blank rows = not tested)  SHOULDER SPECIAL TESTS: Impingement tests: Painful arc test: positive    PALPATION:  Tenderness/tightness R cervical paraspinals, upper trap, levator scapulae, supraspintus, pec minor   TODAY'S TREATMENT:                                                                                                                                         DATE:   09/11/23  UBE x 5 min forward - while PT prepared MD note Manual Therapy: to decrease muscle spasm and pain and improve mobility STM/TPR to cervical paraspinals  R UT/LS, R rhomboids, PA  mobs thoracic spine, PA mobs thoracic spine, gentle traction, skilled palpation and monitoring during dry needling. Trigger Point Dry-Needling  Treatment instructions: Expect mild to moderate muscle soreness. S/S of pneumothorax if dry needled over a lung field, and to seek immediate medical attention should they occur. Patient verbalized understanding of these instructions and education. Patient Consent Given: Yes Education handout provided: Yes Muscles treated:  R UT, R L/S, R C4-5 multifidi,  Electrical stimulation performed: No Parameters: N/A Treatment response/outcome: Twitch Response Elicited and Palpable Increase in Muscle Length Mechanical  Cervical Traction x 15 min including set-up and steps up, treatment time at 15 lbs max 10 min, 60 sec on/10 sec rest, 2 steps up, 2 steps down, with table at 20 deg decline, to decrease pain.   08/21/23 Therapeutic Exercise: to improve strength and mobility.  Demo, verbal and tactile cues throughout for technique. UBE L1 x 4 min forward Prone flexion 2 x 10 Prone extension 2 x 10  Prone scaption 2 x 10  Prone Rows 2 x 10  Standing wall slide flexion x 10  Manual Therapy: to decrease muscle spasm and pain and improve mobility STM/TPR to cervical paraspinals, R UT/LS, R rhomboids, PA mobs thoracic spine, R scapular mobs, skilled palpation and monitoring during dry needling. Trigger Point Dry-Needling  Treatment instructions: Expect mild to moderate muscle soreness. S/S of pneumothorax  if dry needled over a lung field, and to seek immediate medical attention should they occur. Patient verbalized understanding of these instructions and education. Patient Consent Given: Yes Education handout provided: Yes Muscles treated:  R UT, R L/S Electrical stimulation performed: No Parameters: N/A Treatment response/outcome: Twitch Response Elicited and Palpable Increase in Muscle Length   08/14/23 Therapeutic Exercise: to improve strength and mobility.   Demo, verbal and tactile cues throughout for technique.  UBE x 2 minA AAROM forward flexion Swiffer x 20  AAROM scaption swiffer x 20  AAROM shoulder external rotation with cane x 10 AAROM shoulder abduction with cane x 10  Review HEP.  Manual Therapy: to decrease muscle spasm and pain and improve mobility STM/TPR to cervical paraspinals, R UT/LS, R UPA mobs cervical spine grade 1-2, skilled palpation and monitoring during dry needling. Trigger Point Dry-Needling  Treatment instructions: Expect mild to moderate muscle soreness. S/S of pneumothorax if dry needled over a lung field, and to seek immediate medical attention should they occur. Patient verbalized understanding of these instructions and education. Patient Consent Given: Yes Education handout provided: Yes Muscles treated: R cervical multifidi C3-6, R UT, R L/S Electrical stimulation performed: No Parameters: N/A Treatment response/outcome: Twitch Response Elicited and Palpable Increase in Muscle Length  PATIENT EDUCATION: Education details: HEP review Person educated: Patient Education method: Programmer, multimedia, Facilities manager, Verbal cues, and Handouts Education comprehension: verbalized understanding and returned demonstration  HOME EXERCISE PROGRAM: Access Code: [email or web address] URL: [email or web address] Date: 08/21/2023 Prepared by: Harrie Foreman  Exercises - Seated Cervical Retraction  - 1 x daily - 7 x weekly - 1 sets - 10 reps - Gentle Levator Scapulae Stretch  - 1 x daily - 7 x weekly - 1 sets - 3 reps - 10 hold - Sternocleidomastoid Stretch  - 1 x daily - 7 x weekly - 1 sets - 3 reps - 10 hold - Seated Gentle Upper Trapezius Stretch  - 1 x daily - 7 x weekly - 1 sets - 3 reps - 10 hold - Standing Shoulder Abduction AAROM with Dowel  - 2 x daily - 7 x weekly - 1 sets - 10 reps - Seated Shoulder Flexion Extension AAROM with Dowel into Wall  - 2 x daily - 7 x weekly - 1 sets - 10 reps - Seated Shoulder  Circles AAROM with Dowel into Wall  - 2 x daily - 7 x weekly - 1 sets - 10 reps - Standing Shoulder External Rotation AAROM with Dowel  - 2 x daily - 7 x weekly - 1 sets - 10 reps - Prone Shoulder Flexion  - 1 x daily - 7 x weekly - 3 sets - 10 reps - Prone Single Arm Shoulder Y  - 1 x daily - 7 x weekly - 3 sets - 10 reps - Prone Single Arm Shoulder Extension  - 1 x daily - 7 x weekly - 3 sets - 10 reps - Prone Shoulder Row  - 1 x daily - 7 x weekly - 3 sets - 10 reps  ASSESSMENT:  CLINICAL IMPRESSION: Zachary Morales reports that he was doing well, no pain just tightness in neck and R shoulder, until last night when returned to full duty, which aggravated his neck/R shoulder pain significantly.   Gave him an updated work note with dates extended, then focused on manual therapy today to decrease muscle spasm and pain with good twitch response.  He also reported decreased pain and spasm with manual cervical traction, so  placed on mechanic cervical traction with gentle #15 pull to further decrease spasm, reported decreased pain after interventions.     Zachary Morales continues to demonstrate potential for improvement and would benefit from continued skilled therapy to address impairments.      OBJECTIVE IMPAIRMENTS: decreased activity tolerance, decreased ROM, decreased strength, impaired perceived functional ability, increased muscle spasms, impaired sensation, impaired UE functional use, and pain.   ACTIVITY LIMITATIONS: carrying, lifting, sleeping, reach over head, and hygiene/grooming  PARTICIPATION LIMITATIONS: driving and occupation  PERSONAL FACTORS: 1-2 comorbidities: Anxiety, depression, seizure disorder, smoker  are also affecting patient's functional outcome.   REHAB POTENTIAL: Good  CLINICAL DECISION MAKING: Stable/uncomplicated  EVALUATION COMPLEXITY: Low   GOALS: Goals reviewed with patient? Yes  SHORT TERM GOALS: Target date: 08/14/2023   Patient will be independent with  initial HEP.  Baseline: given Goal status: MET 08/14/23  LONG TERM GOALS: Target date: 09/25/2023   Patient will be independent with advanced/ongoing HEP to improve outcomes and carryover.  Baseline:  Goal status: IN PROGRESS  2.  Patient will report 75% improvement in Right shoulder pain to improve QOL.  Baseline:  Goal status: IN PROGRESS  3.  Patient to improve Right shoulder AROM to WNL without pain provocation to allow for increased ease of ADLs.  Baseline: see objective Goal status: IN PROGRESS  4.  Patient will demonstrate improved functional UE strength as demonstrated by 5/5 R shoulder strength without pain. Baseline: see objective Goal status: IN PROGRESS  5.  Patient will report at least 10 points improvement on QuickDash to demonstrate improved functional ability.  Baseline: 50% Goal status: IN PROGRESS  6.  Patient will be able to lift up to 70lbs without pain to return to work without restriction.    Baseline: lifting >20lbs increases pain.  Goal status: IN PROGRESS   7. Patient will demonstrate at least 45 deg cervical AROM all directions for safety with driving without pain.  Baseline: see objective Goal status: IN PROGRESS   PLAN:  PT FREQUENCY: 1x/week patient preference due to work  PT DURATION: 8 weeks  PLANNED INTERVENTIONS: Therapeutic exercises, Therapeutic activity, Neuromuscular re-education, Balance training, Gait training, Patient/Family education, Self Care, Joint mobilization, Joint manipulation, Dry Needling, Electrical stimulation, Spinal mobilization, Taping, Traction, Ultrasound, Manual therapy, and Re-evaluation  PLAN FOR NEXT SESSION: review and progress HEP, manual therapy and TrDN   Jena Gauss, PT, DPT 09/11/2023, 5:40 PM

## 2023-09-18 ENCOUNTER — Emergency Department (HOSPITAL_COMMUNITY): Payer: BC Managed Care – PPO

## 2023-09-18 ENCOUNTER — Ambulatory Visit: Payer: BC Managed Care – PPO | Admitting: Physical Therapy

## 2023-09-18 ENCOUNTER — Other Ambulatory Visit: Payer: Self-pay

## 2023-09-18 ENCOUNTER — Emergency Department (HOSPITAL_COMMUNITY)
Admission: EM | Admit: 2023-09-18 | Discharge: 2023-09-18 | Disposition: A | Discharge status: Home / Self Care | Payer: BC Managed Care – PPO | Attending: Student | Admitting: Student

## 2023-09-18 ENCOUNTER — Encounter (HOSPITAL_COMMUNITY): Payer: Self-pay

## 2023-09-18 DIAGNOSIS — N132 Hydronephrosis with renal and ureteral calculous obstruction: Secondary | ICD-10-CM | POA: Diagnosis not present

## 2023-09-18 DIAGNOSIS — R339 Retention of urine, unspecified: Secondary | ICD-10-CM

## 2023-09-18 DIAGNOSIS — J45909 Unspecified asthma, uncomplicated: Secondary | ICD-10-CM | POA: Diagnosis not present

## 2023-09-18 DIAGNOSIS — F17210 Nicotine dependence, cigarettes, uncomplicated: Secondary | ICD-10-CM | POA: Diagnosis not present

## 2023-09-18 DIAGNOSIS — R300 Dysuria: Secondary | ICD-10-CM | POA: Diagnosis present

## 2023-09-18 DIAGNOSIS — N200 Calculus of kidney: Secondary | ICD-10-CM

## 2023-09-18 LAB — BASIC METABOLIC PANEL
Anion gap: 10 (ref 5–15)
BUN: 12 mg/dL (ref 6–20)
CO2: 27 mmol/L (ref 22–32)
Calcium: 9.7 mg/dL (ref 8.9–10.3)
Chloride: 100 mmol/L (ref 98–111)
Creatinine, Ser: 1.4 mg/dL — ABNORMAL HIGH (ref 0.61–1.24)
GFR, Estimated: >60 mL/min (ref >60)
Glucose, Bld: 103 mg/dL — ABNORMAL HIGH (ref 70–99)
Potassium: 3.3 mmol/L — ABNORMAL LOW (ref 3.5–5.1)
Sodium: 137 mmol/L (ref 135–145)

## 2023-09-18 LAB — CBC
HCT: 44.9 % (ref 39.0–52.0)
Hemoglobin: 15 g/dL (ref 13.0–17.0)
MCH: 30.3 pg (ref 26.0–34.0)
MCHC: 33.4 g/dL (ref 30.0–36.0)
MCV: 90.7 fL (ref 80.0–100.0)
Platelets: 288 10*3/uL (ref 150–400)
RBC: 4.95 MIL/uL (ref 4.22–5.81)
RDW: 12.3 % (ref 11.5–15.5)
WBC: 8.8 10*3/uL (ref 4.0–10.5)
nRBC: 0 % (ref 0.0–0.2)

## 2023-09-18 LAB — URINALYSIS, ROUTINE W REFLEX MICROSCOPIC
Bilirubin Urine: NEGATIVE
Glucose, UA: NEGATIVE mg/dL
Ketones, ur: 5 mg/dL — AB
Leukocytes,Ua: NEGATIVE
Nitrite: NEGATIVE
Protein, ur: NEGATIVE mg/dL
Specific Gravity, Urine: 1.014 (ref 1.005–1.030)
pH: 7 (ref 5.0–8.0)

## 2023-09-18 MED ORDER — LACTATED RINGERS IV BOLUS
1000.0000 mL | Freq: Once | INTRAVENOUS | Status: AC
  Administered 2023-09-18: 1000 mL via INTRAVENOUS

## 2023-09-18 MED ORDER — KETOROLAC TROMETHAMINE 15 MG/ML IJ SOLN
15.0000 mg | Freq: Once | INTRAMUSCULAR | Status: AC
  Administered 2023-09-18: 15 mg via INTRAVENOUS
  Filled 2023-09-18: qty 1

## 2023-09-18 MED ORDER — ONDANSETRON HCL 4 MG/2ML IJ SOLN
4.0000 mg | Freq: Once | INTRAMUSCULAR | Status: AC
  Administered 2023-09-18: 4 mg via INTRAVENOUS
  Filled 2023-09-18: qty 2

## 2023-09-18 MED ORDER — NAPROXEN 375 MG PO TABS: 375.0000 mg | ORAL_TABLET | Freq: Two times a day (BID) | ORAL | 0 refills | Status: AC

## 2023-09-18 MED ORDER — TAMSULOSIN HCL 0.4 MG PO CAPS
0.4000 mg | ORAL_CAPSULE | Freq: Once | ORAL | Status: AC
  Administered 2023-09-18: 0.4 mg via ORAL
  Filled 2023-09-18: qty 1

## 2023-09-18 MED ORDER — TAMSULOSIN HCL 0.4 MG PO CAPS: 0.4000 mg | ORAL_CAPSULE | Freq: Every day | ORAL | 0 refills | Status: AC

## 2023-09-18 MED ORDER — MORPHINE SULFATE (PF) 4 MG/ML IV SOLN
4.0000 mg | Freq: Once | INTRAVENOUS | Status: AC
  Administered 2023-09-18: 4 mg via INTRAVENOUS
  Filled 2023-09-18: qty 1

## 2023-09-18 NOTE — ED Notes (Signed)
Pt given urine strainer per MD order

## 2023-09-18 NOTE — ED Triage Notes (Signed)
Pt has not fully voided since last Wednesday. Pt was on abx for possible UTI but pain has gotten worse. Pt has left flank pain. Tried to void but could only get drops out. Denies hematuria

## 2023-09-18 NOTE — ED Provider Notes (Signed)
[place] EMERGENCY DEPARTMENT AT [facility] Provider Note  CSN: [id number] Arrival date & time: 09/18/23 0255  Chief Complaint(s) urine retention and Flank Pain  HPI Zachary Morales is a 37 y.o. male with PMH anxiety, asthma, depression, seizure who presents emergency room for evaluation of left flank pain and dysuria.  States that he has been on antibiotics for 1 week for possible UTI but this has not improved his symptoms.  States that he is having inability to fully void and only gets a few drops out when he attempts to go.  States that his severe flank pain began today on the left and radiates down to the groin.  Endorses nausea but denies vomiting, chest pain, shortness of breath, headache, fever or other systemic symptoms.   Past Medical History Past Medical History:  Diagnosis Date   Anxiety    Asthma    Depression    Seizures (HCC)    Patient Active Problem List   Diagnosis Date Noted   Seizure disorder (HCC) 05/02/2013   Home Medication(s) Prior to Admission medications   Medication Sig Start Date End Date Taking? Authorizing Provider  naproxen (NAPROSYN) 375 MG tablet Take 1 tablet (375 mg total) by mouth 2 (two) times daily. 09/18/23  Yes Tauheed Mcfayden, MD  tamsulosin (FLOMAX) 0.4 MG CAPS capsule Take 1 capsule (0.4 mg total) by mouth daily for 7 days. 09/18/23 09/25/23 Yes Coty Larsh, MD  albuterol (PROVENTIL HFA;VENTOLIN HFA) 108 (90 BASE) MCG/ACT inhaler Inhale 2 puffs into the lungs every 6 (six) hours as needed for wheezing.     [provider]  ALPRAZolam Prudy Feeler) 1 MG tablet Take 1 mg by mouth at bedtime as needed for anxiety.    [provider]  levETIRAcetam (KEPPRA) 500 MG tablet Take 1 tablet (500 mg total) by mouth 2 (two) times daily. 05/22/15   Ward, Layla Maw, DO  naproxen sodium (ANAPROX) 220 MG tablet Take 220 mg by mouth once.    [provider]                                                                                                                                     Past Surgical History Past Surgical History:  Procedure Laterality Date   APPENDECTOMY     Family History History reviewed. No pertinent family history.  Social History Social History   Tobacco Use   Smoking status: Every Day    Current packs/day: 0.50    Average packs/day: 0.5 packs/day for 2.2 years (1.1 ttl pk-yrs)    Types: Cigarettes    Start date: 07/2021   Smokeless tobacco: Never  Substance Use Topics   Alcohol use: Yes    Comment: occassional   Drug use: Yes    Types: Marijuana   Allergies Patient has no known allergies.  Review of Systems Review of Systems  Genitourinary:  Positive for difficulty urinating, dysuria and flank  pain.    Physical Exam Vital Signs  I have reviewed the triage vital signs BP 131/79   Pulse 75   Temp 97.9 F (36.6 C) (Oral)   Resp 20   Ht 5\' 8"  (1.727 m)   Wt 72.6 kg   SpO2 100%   BMI 24.34 kg/m   Physical Exam Constitutional:      General: He is in acute distress.     Appearance: Normal appearance. He is ill-appearing.  HENT:     Head: Normocephalic and atraumatic.     Nose: No congestion or rhinorrhea.  Eyes:     General:        Right eye: No discharge.        Left eye: No discharge.     Extraocular Movements: Extraocular movements intact.     Pupils: Pupils are equal, round, and reactive to light.  Cardiovascular:     Rate and Rhythm: Normal rate and regular rhythm.     Heart sounds: No murmur heard. Pulmonary:     Effort: No respiratory distress.     Breath sounds: No wheezing or rales.  Abdominal:     General: There is no distension.     Tenderness: There is abdominal tenderness. There is left CVA tenderness.  Musculoskeletal:        General: Normal range of motion.     Cervical back: Normal range of motion.  Skin:    General: Skin is warm and dry.  Neurological:     General: No focal deficit present.     Mental Status: He is alert.      ED Results and Treatments Labs (all labs ordered are listed, but only abnormal results are displayed) Labs Reviewed  BASIC METABOLIC PANEL - Abnormal; Notable for the following components:      Result Value   Potassium 3.3 (*)    Glucose, Bld 103 (*)    Creatinine, Ser 1.40 (*)    All other components within normal limits  URINALYSIS, ROUTINE W REFLEX MICROSCOPIC - Abnormal; Notable for the following components:   APPearance CLOUDY (*)    Hgb urine dipstick SMALL (*)    Ketones, ur 5 (*)    Bacteria, UA FEW (*)    All other components within normal limits  CBC                                                                                                                          Radiology CT Renal Stone Study  Result Date: 09/18/2023 CLINICAL DATA:  Left abdominal/flank pain, stone suspected. Difficulty urinating. EXAM: CT ABDOMEN AND PELVIS WITHOUT CONTRAST TECHNIQUE: Multidetector CT imaging of the abdomen and pelvis was performed following the standard protocol without IV contrast. RADIATION DOSE REDUCTION: This exam was performed according to the departmental dose-optimization program which includes automated exposure control, adjustment of the mA and/or kV according to patient size and/or use of iterative reconstruction technique. COMPARISON:  10/23/2007. FINDINGS: Lower chest: No acute  abnormality. Hepatobiliary: No focal liver abnormality is seen. No gallstones, gallbladder wall thickening, or biliary dilatation. Pancreas: Unremarkable. No pancreatic ductal dilatation or surrounding inflammatory changes. Spleen: Normal in size without focal abnormality. Adrenals/Urinary Tract: No adrenal nodule or mass. No renal calculus is seen bilaterally. There is mild hydroureteronephrosis on the left with a 4 mm calculus in the urinary bladder on the left suggesting recently passed stone. Periureteral fat stranding is noted on the left. No obstructive uropathy on the right. Stomach/Bowel:  Stomach is within normal limits. Appendix is surgically absent. No evidence of bowel wall thickening, distention, or inflammatory changes. No free air or pneumatosis. Vascular/Lymphatic: No significant vascular findings are present. No enlarged abdominal or pelvic lymph nodes. Reproductive: Prostate is unremarkable. Other: No abdominopelvic ascites. Musculoskeletal: No acute osseous abnormality. IMPRESSION: Mild hydroureteronephrosis on the left with a 4 mm calculus in the urinary bladder on the left suggesting recently passed stone. Electronically Signed   By: Thornell Sartorius M.D.   On: 09/18/2023 04:52    Pertinent labs & imaging results that were available during my care of the patient were reviewed by me and considered in my medical decision making (see MDM for details).  Medications Ordered in ED Medications  morphine (PF) 4 MG/ML injection 4 mg (4 mg Intravenous Given 09/18/23 0325)  ondansetron (ZOFRAN) injection 4 mg (4 mg Intravenous Given 09/18/23 0325)  ketorolac (TORADOL) 15 MG/ML injection 15 mg (15 mg Intravenous Given 09/18/23 0341)  lactated ringers bolus 1,000 mL (1,000 mLs Intravenous New Bag/Given 09/18/23 0342)  tamsulosin (FLOMAX) capsule 0.4 mg (0.4 mg Oral Given 09/18/23 0447)                                                                                                                                     Procedures Procedures  (including critical care time)  Medical Decision Making / ED Course   This patient presents to the ED for concern of flank pain, abdominal pain, this involves an extensive number of treatment options, and is a complaint that carries with it a high risk of complications and morbidity.  The differential diagnosis includes nephrolithiasis, pyelonephritis, obstruction, AAA, musculoskeletal strain, vertebral fracture, intra-abdominal abscess, diverticulitis  MDM: Patient emergency room for evaluation of flank pain.  Physical exam reveals significant  left-sided CVA tenderness and suprapubic abdominal tenderness.  Laboratory evaluation with potassium of 3.3, creatinine 1.40 which is an elevation for this patient.  CT abdomen pelvis showing mild hydroureteronephrosis on the with a 4 mm calculus in the bladder suggesting recently passed stone.  On reevaluation after CT patient's symptoms have significant improved.  He is still having some urinary retention but after Flomax and fluid resuscitation in the ER he was able to urinate.  Urinalysis without evidence of infection.  At this time he does not meet inpatient criteria for admission and will need to follow-up outpatient with urology.  We will keep the patient on  Flomax to assist with urinary flow and patient will strain his urine at home.  Patient then discharged with outpatient urology follow-up and return precautions of which he and his wife voiced understanding.   Additional history obtained: -Additional history obtained from wife -External records from outside source obtained and reviewed including: Chart review including previous notes, labs, imaging, consultation notes   Lab Tests: -I ordered, reviewed, and interpreted labs.   The pertinent results include:   Labs Reviewed  BASIC METABOLIC PANEL - Abnormal; Notable for the following components:      Result Value   Potassium 3.3 (*)    Glucose, Bld 103 (*)    Creatinine, Ser 1.40 (*)    All other components within normal limits  URINALYSIS, ROUTINE W REFLEX MICROSCOPIC - Abnormal; Notable for the following components:   APPearance CLOUDY (*)    Hgb urine dipstick SMALL (*)    Ketones, ur 5 (*)    Bacteria, UA FEW (*)    All other components within normal limits  CBC       Imaging Studies ordered: I ordered imaging studies including CTAP I independently visualized and interpreted imaging. I agree with the radiologist interpretation   Medicines ordered and prescription drug management: Meds ordered this encounter   Medications   morphine (PF) 4 MG/ML injection 4 mg   ondansetron (ZOFRAN) injection 4 mg   ketorolac (TORADOL) 15 MG/ML injection 15 mg   lactated ringers bolus 1,000 mL   tamsulosin (FLOMAX) capsule 0.4 mg   tamsulosin (FLOMAX) 0.4 MG CAPS capsule    Sig: Take 1 capsule (0.4 mg total) by mouth daily for 7 days.    Dispense:  7 capsule    Refill:  0   naproxen (NAPROSYN) 375 MG tablet    Sig: Take 1 tablet (375 mg total) by mouth 2 (two) times daily.    Dispense:  20 tablet    Refill:  0    -I have reviewed the patients home medicines and have made adjustments as needed  Critical interventions none   Cardiac Monitoring: The patient was maintained on a cardiac monitor.  I personally viewed and interpreted the cardiac monitored which showed an underlying rhythm of: NSR  Social Determinants of Health:  Factors impacting patients care include: none   Reevaluation: After the interventions noted above, I reevaluated the patient and found that they have :improved  Co morbidities that complicate the patient evaluation  Past Medical History:  Diagnosis Date   Anxiety    Asthma    Depression    Seizures (HCC)       Dispostion: I considered admission for this patient, but at this time he does not meet inpatient criteria for admission he is safe for discharge with outpatient follow-up     Final Clinical Impression(s) / ED Diagnoses Final diagnoses:  Nephrolithiasis  Urinary retention     @PCDICTATION @    Glendora Score, MD 09/18/23 [phone]

## 2023-09-25 ENCOUNTER — Encounter: Payer: Self-pay | Admitting: Physical Therapy

## 2023-09-25 ENCOUNTER — Ambulatory Visit: Payer: BC Managed Care – PPO | Attending: Internal Medicine | Admitting: Physical Therapy

## 2023-09-25 DIAGNOSIS — R252 Cramp and spasm: Secondary | ICD-10-CM | POA: Insufficient documentation

## 2023-09-25 DIAGNOSIS — M6281 Muscle weakness (generalized): Secondary | ICD-10-CM | POA: Diagnosis present

## 2023-09-25 DIAGNOSIS — M25511 Pain in right shoulder: Secondary | ICD-10-CM | POA: Diagnosis present

## 2023-09-25 DIAGNOSIS — M542 Cervicalgia: Secondary | ICD-10-CM | POA: Diagnosis present

## 2023-09-25 NOTE — Therapy (Signed)
OUTPATIENT PHYSICAL THERAPY TREATMENT   Patient Name: Zachary Morales MRN: [id number] DOB:Apr 03, 1986, 37 y.o., male Today's Date: 09/25/2023  END OF SESSION:  PT End of Session - 09/25/23 1617     Visit Number 5    Date for PT Re-Evaluation 10/23/23    Authorization Type [employer]    PT Start Time 1533    PT Stop Time 1618    PT Time Calculation (min) 45 min    Activity Tolerance Patient tolerated treatment well    Behavior During Therapy WFL for tasks assessed/performed             Past Medical History:  Diagnosis Date   Anxiety    Asthma    Depression    Seizures (HCC)    Past Surgical History:  Procedure Laterality Date   APPENDECTOMY     Patient Active Problem List   Diagnosis Date Noted   Seizure disorder (HCC) 05/02/2013    PCP: Jackie Plum, MD   REFERRING PROVIDER: Jackie Plum, MD  REFERRING DIAG: M25.511 (ICD-10-CM) - Pain in right shoulder   THERAPY DIAG:  Acute pain of right shoulder  Muscle weakness (generalized)  Cervicalgia  Cramp and spasm  Rationale for Evaluation and Treatment: Rehabilitation  ONSET DATE: 1 month ago  SUBJECTIVE:                                                                                                                                                                                      SUBJECTIVE STATEMENT: Couldn't come last week, just got out of hospital with kidney stone.  Still very tight, R shoulder painful Hand dominance: Right  PERTINENT HISTORY: Anxiety, depression, seizure disorder, smoker.   PAIN:  Are you having pain? Yes: NPRS scale: 6-7/10 Pain location: R upper shoulder  Pain description: achey, sharp, burn Aggravating factors: lifting increases pain to 9/10, sleeping on R side makes it worse.   Relieving factors: rest  PRECAUTIONS: None  RED FLAGS: None   WEIGHT BEARING RESTRICTIONS: No  FALLS:  Has patient fallen in last 6 months? 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 2024  OBJECTIVE:   DIAGNOSTIC FINDINGS:  Per patient X-rays taken with no findings.   PATIENT SURVEYS:  Quick Dash 50%  COGNITION: Overall cognitive status: Within functional limits for tasks assessed     SENSATION: Light touch: Impaired  C5 dermatome on R  by 20%  POSTURE: Rounded shoulders  CERVICAL ROM:  AROM eval  Cervical Flexion 35  Cervical Extension 32  Cervical Rotation to Left 50  Cervical Rotation to Right 55  Left Sidebend 28  Right Sidebend 30     UPPER EXTREMITY ROM:   Active ROM Right* eval Left eval  Shoulder flexion 110 160  Shoulder extension    Shoulder abduction 92 160  Shoulder adduction    Shoulder internal rotation waist T8  Shoulder external rotation cant T4  (Blank rows = not tested)  UPPER EXTREMITY MMT:  MMT Right* eval Left eval  Shoulder flexion 4+p! 5  Shoulder extension    Shoulder abduction 4+p! 5  Shoulder internal rotation 5 5  Shoulder external rotation 5 5  Elbow flexion 5 5  Elbow extension 5 5  Wrist flexion 5 5  Wrist extension 5 5  Grip strength (lbs) 55, 55, 60  55, 65, 60   (Blank rows = not tested)  SHOULDER SPECIAL TESTS: Impingement tests: Painful arc test: positive    PALPATION:  Tenderness/tightness R cervical paraspinals, upper trap, levator scapulae, supraspintus, pec minor   TODAY'S TREATMENT:                                                                                                                                         DATE:   09/25/23 Therapeutic Exercise: to improve strength and mobility.  Demo, verbal and tactile cues throughout for technique. S/L open books with bent arm -  laying on Left side x 10 Standing open  books for left side - R shoulder too painful to lay on Manual Therapy: to decrease muscle spasm and pain and improve mobility STM/TPR to cervical paraspinals, R UT/LS, PA mobs thoracic spine, R supraspinatus, PA/UPA mobs cervical spine grade 2-3, NAGS into rotations, skilled palpation and monitoring during dry needling. Trigger Point Dry-Needling  Treatment instructions: Expect mild to moderate muscle soreness. S/S of pneumothorax if dry needled over a lung field, and to seek immediate medical attention should they occur. Patient verbalized understanding of these instructions and education. Patient Consent Given: Yes Education handout provided: Previously Provided Muscles treated:  R UT, R L/S, bil C4-6 multifidi, R supraspinatus, R latissimus Electrical stimulation performed: No Parameters: N/A Treatment response/outcome: Twitch Response Elicited and Palpable Increase in Muscle Length  09/11/23  UBE x 5 min forward - while PT prepared MD note Manual Therapy: to decrease muscle spasm and pain and improve mobility STM/TPR to cervical paraspinals  R UT/LS, R rhomboids, PA mobs thoracic spine, PA mobs thoracic spine, gentle traction, skilled palpation and monitoring during dry needling. Trigger Point Dry-Needling  Treatment instructions: Expect mild to moderate muscle soreness. S/S of pneumothorax if dry needled over a lung field, and to seek immediate medical attention should they occur. Patient verbalized understanding of these instructions and education. Patient Consent Given: Yes Education handout provided: Yes Muscles  treated:  R UT, R L/S, R C4-5 multifidi,  Electrical stimulation performed: No Parameters: N/A Treatment response/outcome: Twitch Response Elicited and Palpable Increase in Muscle Length Mechanical  Cervical Traction x 15 min including set-up and steps up, treatment time at 15 lbs max 10 min, 60 sec on/10 sec rest, 2 steps up, 2  steps down, with table at 20 deg decline, to decrease pain.   08/21/23 Therapeutic Exercise: to improve strength and mobility.  Demo, verbal and tactile cues throughout for technique. UBE L1 x 4 min forward Prone flexion 2 x 10 Prone extension 2 x 10  Prone scaption 2 x 10  Prone Rows 2 x 10  Standing wall slide flexion x 10  Manual Therapy: to decrease muscle spasm and pain and improve mobility STM/TPR to cervical paraspinals, R UT/LS, R rhomboids, PA mobs thoracic spine, R scapular mobs, skilled palpation and monitoring during dry needling. Trigger Point Dry-Needling  Treatment instructions: Expect mild to moderate muscle soreness. S/S of pneumothorax if dry needled over a lung field, and to seek immediate medical attention should they occur. Patient verbalized understanding of these instructions and education. Patient Consent Given: Yes Education handout provided: Yes Muscles treated:  R UT, R L/S Electrical stimulation performed: No Parameters: N/A Treatment response/outcome: Twitch Response Elicited and Palpable Increase in Muscle Length  PATIENT EDUCATION: Education details: HEP review Person educated: Patient Education method: Programmer, multimedia, Facilities manager, Verbal cues, and Handouts Education comprehension: verbalized understanding and returned demonstration  HOME EXERCISE PROGRAM: Access Code: [email or web address] URL: [email or web address] Date: 08/21/2023 Prepared by: Harrie Foreman  Exercises - Seated Cervical Retraction  - 1 x daily - 7 x weekly - 1 sets - 10 reps - Gentle Levator Scapulae Stretch  - 1 x daily - 7 x weekly - 1 sets - 3 reps - 10 hold - Sternocleidomastoid Stretch  - 1 x daily - 7 x weekly - 1 sets - 3 reps - 10 hold - Seated Gentle Upper Trapezius Stretch  - 1 x daily - 7 x weekly - 1 sets - 3 reps - 10 hold - Standing Shoulder Abduction AAROM with Dowel  - 2 x daily - 7 x weekly - 1 sets - 10 reps - Seated Shoulder Flexion Extension AAROM with  Dowel into Wall  - 2 x daily - 7 x weekly - 1 sets - 10 reps - Seated Shoulder Circles AAROM with Dowel into Wall  - 2 x daily - 7 x weekly - 1 sets - 10 reps - Standing Shoulder External Rotation AAROM with Dowel  - 2 x daily - 7 x weekly - 1 sets - 10 reps - Prone Shoulder Flexion  - 1 x daily - 7 x weekly - 3 sets - 10 reps - Prone Single Arm Shoulder Y  - 1 x daily - 7 x weekly - 3 sets - 10 reps - Prone Single Arm Shoulder Extension  - 1 x daily - 7 x weekly - 3 sets - 10 reps - Prone Shoulder Row  - 1 x daily - 7 x weekly - 3 sets - 10 reps  ASSESSMENT:  CLINICAL IMPRESSION: DEWAINE MOROCHO reported increased spasm/tightness in neck after 2 weeks unable to attend therapy after hospitalization for kidney stones.  Tried open book stretch today, unable to lay on R side due to R shoulder pain.  Continued to focus on manual therapy, strong twitch response on R supraspinatus.     LUCIA MCCREADIE continues to demonstrate potential for improvement  and would benefit from continued skilled therapy to address impairments.      OBJECTIVE IMPAIRMENTS: decreased activity tolerance, decreased ROM, decreased strength, impaired perceived functional ability, increased muscle spasms, impaired sensation, impaired UE functional use, and pain.   ACTIVITY LIMITATIONS: carrying, lifting, sleeping, reach over head, and hygiene/grooming  PARTICIPATION LIMITATIONS: driving and occupation  PERSONAL FACTORS: 1-2 comorbidities: Anxiety, depression, seizure disorder, smoker  are also affecting patient's functional outcome.   REHAB POTENTIAL: Good  CLINICAL DECISION MAKING: Stable/uncomplicated  EVALUATION COMPLEXITY: Low   GOALS: Goals reviewed with patient? Yes  SHORT TERM GOALS: Target date: 08/14/2023   Patient will be independent with initial HEP.  Baseline: given Goal status: MET 08/14/23  LONG TERM GOALS: Target date: 09/25/2023   Patient will be independent with advanced/ongoing HEP to improve  outcomes and carryover.  Baseline:  Goal status: IN PROGRESS  2.  Patient will report 75% improvement in Right shoulder pain to improve QOL.  Baseline:  Goal status: IN PROGRESS  3.  Patient to improve Right shoulder AROM to WNL without pain provocation to allow for increased ease of ADLs.  Baseline: see objective Goal status: IN PROGRESS  4.  Patient will demonstrate improved functional UE strength as demonstrated by 5/5 R shoulder strength without pain. Baseline: see objective Goal status: IN PROGRESS  5.  Patient will report at least 10 points improvement on QuickDash to demonstrate improved functional ability.  Baseline: 50% Goal status: IN PROGRESS  6.  Patient will be able to lift up to 70lbs without pain to return to work without restriction.    Baseline: lifting >20lbs increases pain.  Goal status: IN PROGRESS   7. Patient will demonstrate at least 45 deg cervical AROM all directions for safety with driving without pain.  Baseline: see objective Goal status: IN PROGRESS   PLAN:  PT FREQUENCY: 1x/week patient preference due to work  PT DURATION: 8 weeks  PLANNED INTERVENTIONS: Therapeutic exercises, Therapeutic activity, Neuromuscular re-education, Balance training, Gait training, Patient/Family education, Self Care, Joint mobilization, Joint manipulation, Dry Needling, Electrical stimulation, Spinal mobilization, Taping, Traction, Ultrasound, Manual therapy, and Re-evaluation  PLAN FOR NEXT SESSION: review and progress HEP, manual therapy and TrDN   Jena Gauss, PT, DPT 09/25/2023, 4:29 PM

## 2023-10-02 ENCOUNTER — Ambulatory Visit: Payer: BC Managed Care – PPO | Admitting: Physical Therapy

## 2023-10-02 ENCOUNTER — Encounter: Payer: Self-pay | Admitting: Physical Therapy

## 2023-10-02 DIAGNOSIS — M542 Cervicalgia: Secondary | ICD-10-CM

## 2023-10-02 DIAGNOSIS — M25511 Pain in right shoulder: Secondary | ICD-10-CM

## 2023-10-02 DIAGNOSIS — R252 Cramp and spasm: Secondary | ICD-10-CM

## 2023-10-02 DIAGNOSIS — M6281 Muscle weakness (generalized): Secondary | ICD-10-CM

## 2023-10-02 NOTE — Therapy (Signed)
OUTPATIENT PHYSICAL THERAPY TREATMENT   Patient Name: Zachary Morales MRN: [id number] DOB:Mar 10, 1986, 37 y.o., male Today's Date: 10/02/2023  END OF SESSION:  PT End of Session - 10/02/23 1320     Visit Number 6    Date for PT Re-Evaluation 10/23/23    Authorization Type BCBS    PT Start Time 1317    PT Stop Time 1400    PT Time Calculation (min) 43 min    Activity Tolerance Patient tolerated treatment well    Behavior During Therapy WFL for tasks assessed/performed             Past Medical History:  Diagnosis Date   Anxiety    Asthma    Depression    Seizures (HCC)    Past Surgical History:  Procedure Laterality Date   APPENDECTOMY     Patient Active Problem List   Diagnosis Date Noted   Seizure disorder (HCC) 05/02/2013    PCP: Jackie Plum, MD   REFERRING PROVIDER: Jackie Plum, MD  REFERRING DIAG: M25.511 (ICD-10-CM) - Pain in right shoulder   THERAPY DIAG:  Acute pain of right shoulder  Muscle weakness (generalized)  Cervicalgia  Cramp and spasm  Rationale for Evaluation and Treatment: Rehabilitation  ONSET DATE: 1 month ago  SUBJECTIVE:                                                                                                                                                                                      SUBJECTIVE STATEMENT: Felt great after the last session, then overdid it getting Christmas decorations out of attic, and shoulder hurts now.  Hand dominance: Right  PERTINENT HISTORY: Anxiety, depression, seizure disorder, smoker.   PAIN:  Are you having pain? Yes: NPRS scale: 6-7/10 Pain location: R upper shoulder  Pain description: achey, sharp, burn Aggravating factors: lifting increases pain to 9/10, sleeping on R side makes it worse.   Relieving factors: rest  PRECAUTIONS: None  RED FLAGS: None   WEIGHT BEARING RESTRICTIONS: No  FALLS:  Has patient fallen in last 6 months? 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 2024  OBJECTIVE:   DIAGNOSTIC FINDINGS:  Per patient X-rays taken with no findings.   PATIENT SURVEYS:  Quick Dash 50%  COGNITION: Overall cognitive status: Within functional limits for tasks assessed     SENSATION: Light touch: Impaired  C5 dermatome on  R by 20%  POSTURE: Rounded shoulders  CERVICAL ROM:  AROM eval  Cervical Flexion 35  Cervical Extension 32  Cervical Rotation to Left 50  Cervical Rotation to Right 55  Left Sidebend 28  Right Sidebend 30     UPPER EXTREMITY ROM:   Active ROM Right* eval Left eval  Shoulder flexion 110 160  Shoulder extension    Shoulder abduction 92 160  Shoulder adduction    Shoulder internal rotation waist T8  Shoulder external rotation cant T4  (Blank rows = not tested)  UPPER EXTREMITY MMT:  MMT Right* eval Left eval  Shoulder flexion 4+p! 5  Shoulder extension    Shoulder abduction 4+p! 5  Shoulder internal rotation 5 5  Shoulder external rotation 5 5  Elbow flexion 5 5  Elbow extension 5 5  Wrist flexion 5 5  Wrist extension 5 5  Grip strength (lbs) 55, 55, 60  55, 65, 60   (Blank rows = not tested)  SHOULDER SPECIAL TESTS: Impingement tests: Painful arc test: positive    PALPATION:  Tenderness/tightness R cervical paraspinals, upper trap, levator scapulae, supraspintus, pec minor   TODAY'S TREATMENT:                                                                                                                                         DATE:  10/02/2023 Manual Therapy: to decrease muscle spasm and pain and improve mobility STM/TPR to cervical paraspinals, R UT/LS, PA  mobs thoracic spine, R supraspinatus, scapular mobs,  skilled palpation and monitoring during dry needling. Trigger Point Dry-Needling  Treatment instructions: Expect mild to moderate muscle soreness. S/S of pneumothorax if dry needled over a lung field, and to seek immediate medical attention should they occur. Patient verbalized understanding of these instructions and education. Patient Consent Given: Yes Education handout provided: Previously Provided Muscles treated:  R UT, R L/S, bil C4-6 multifidi, R supraspinatus, R latissimus, R pectoralis, R subscap Electrical stimulation performed: No Parameters: N/A Treatment response/outcome: Twitch Response Elicited and Palpable Increase in Muscle Length  09/25/23 Therapeutic Exercise: to improve strength and mobility.  Demo, verbal and tactile cues throughout for technique. S/L open books with bent arm - laying on Left side x 10 Standing open  books for left side - R shoulder too painful to lay on Manual Therapy: to decrease muscle spasm and pain and improve mobility STM/TPR to cervical paraspinals, R UT/LS, PA mobs thoracic spine, R supraspinatus, PA/UPA mobs cervical spine grade 2-3, NAGS into rotations, skilled palpation and monitoring during dry needling. Trigger Point Dry-Needling  Treatment instructions: Expect mild to moderate muscle soreness. S/S of pneumothorax if dry needled over a lung field, and to seek immediate medical attention should they occur. Patient verbalized understanding of these instructions and education. Patient Consent Given: Yes Education handout provided: Previously Provided Muscles treated:  R UT, R L/S, bil C4-6 multifidi, R supraspinatus,  R latissimus Electrical stimulation performed: No Parameters: N/A Treatment response/outcome: Twitch Response Elicited and Palpable Increase in Muscle Length  09/11/23  UBE x 5 min forward - while PT prepared MD note Manual Therapy: to decrease muscle spasm and pain and improve  mobility STM/TPR to cervical paraspinals  R UT/LS, R rhomboids, PA mobs thoracic spine, PA mobs thoracic spine, gentle traction, skilled palpation and monitoring during dry needling. Trigger Point Dry-Needling  Treatment instructions: Expect mild to moderate muscle soreness. S/S of pneumothorax if dry needled over a lung field, and to seek immediate medical attention should they occur. Patient verbalized understanding of these instructions and education. Patient Consent Given: Yes Education handout provided: Yes Muscles treated:  R UT, R L/S, R C4-5 multifidi,  Electrical stimulation performed: No Parameters: N/A Treatment response/outcome: Twitch Response Elicited and Palpable Increase in Muscle Length Mechanical  Cervical Traction x 15 min including set-up and steps up, treatment time at 15 lbs max 10 min, 60 sec on/10 sec rest, 2 steps up, 2 steps down, with table at 20 deg decline, to decrease pain.    PATIENT EDUCATION: Education details: HEP review Person educated: Patient Education method: Programmer, multimedia, Facilities manager, Verbal cues, and Handouts Education comprehension: verbalized understanding and returned demonstration  HOME EXERCISE PROGRAM: Access Code: [email or web address] URL: [email or web address] Date: 08/21/2023 Prepared by: Harrie Foreman  Exercises - Seated Cervical Retraction  - 1 x daily - 7 x weekly - 1 sets - 10 reps - Gentle Levator Scapulae Stretch  - 1 x daily - 7 x weekly - 1 sets - 3 reps - 10 hold - Sternocleidomastoid Stretch  - 1 x daily - 7 x weekly - 1 sets - 3 reps - 10 hold - Seated Gentle Upper Trapezius Stretch  - 1 x daily - 7 x weekly - 1 sets - 3 reps - 10 hold - Standing Shoulder Abduction AAROM with Dowel  - 2 x daily - 7 x weekly - 1 sets - 10 reps - Seated Shoulder Flexion Extension AAROM with Dowel into Wall  - 2 x daily - 7 x weekly - 1 sets - 10 reps - Seated Shoulder Circles AAROM with Dowel into Wall  - 2 x daily - 7 x weekly - 1  sets - 10 reps - Standing Shoulder External Rotation AAROM with Dowel  - 2 x daily - 7 x weekly - 1 sets - 10 reps - Prone Shoulder Flexion  - 1 x daily - 7 x weekly - 3 sets - 10 reps - Prone Single Arm Shoulder Y  - 1 x daily - 7 x weekly - 3 sets - 10 reps - Prone Single Arm Shoulder Extension  - 1 x daily - 7 x weekly - 3 sets - 10 reps - Prone Shoulder Row  - 1 x daily - 7 x weekly - 3 sets - 10 reps  ASSESSMENT:  CLINICAL IMPRESSION: NEON VANDERWALL reported improved R shoulder pain for 2 days following last session, but overdid overhead lifting resulting in more pain today.  Continued to focus on manual therapy to R shoulder and neck, noted large trigger points in R levator, also tightness in pectoralis and subscap limiting shoulder abduction.  Reported immediate improvement in pain following interventions.     MALLOY COLLETTA continues to demonstrate potential for improvement and would benefit from continued skilled therapy to address impairments.      OBJECTIVE IMPAIRMENTS: decreased activity tolerance, decreased ROM, decreased strength, impaired perceived functional ability, increased muscle  spasms, impaired sensation, impaired UE functional use, and pain.   ACTIVITY LIMITATIONS: carrying, lifting, sleeping, reach over head, and hygiene/grooming  PARTICIPATION LIMITATIONS: driving and occupation  PERSONAL FACTORS: 1-2 comorbidities: Anxiety, depression, seizure disorder, smoker  are also affecting patient's functional outcome.   REHAB POTENTIAL: Good  CLINICAL DECISION MAKING: Stable/uncomplicated  EVALUATION COMPLEXITY: Low   GOALS: Goals reviewed with patient? Yes  SHORT TERM GOALS: Target date: 08/14/2023   Patient will be independent with initial HEP.  Baseline: given Goal status: MET 08/14/23  LONG TERM GOALS: Target date: 09/25/2023   Patient will be independent with advanced/ongoing HEP to improve outcomes and carryover.  Baseline:  Goal status: IN PROGRESS  2.   Patient will report 75% improvement in Right shoulder pain to improve QOL.  Baseline:  Goal status: IN PROGRESS  3.  Patient to improve Right shoulder AROM to WNL without pain provocation to allow for increased ease of ADLs.  Baseline: see objective Goal status: IN PROGRESS  4.  Patient will demonstrate improved functional UE strength as demonstrated by 5/5 R shoulder strength without pain. Baseline: see objective Goal status: IN PROGRESS  5.  Patient will report at least 10 points improvement on QuickDash to demonstrate improved functional ability.  Baseline: 50% Goal status: IN PROGRESS  6.  Patient will be able to lift up to 70lbs without pain to return to work without restriction.    Baseline: lifting >20lbs increases pain.  Goal status: IN PROGRESS   7. Patient will demonstrate at least 45 deg cervical AROM all directions for safety with driving without pain.  Baseline: see objective Goal status: IN PROGRESS   PLAN:  PT FREQUENCY: 1x/week patient preference due to work  PT DURATION: 8 weeks  PLANNED INTERVENTIONS: Therapeutic exercises, Therapeutic activity, Neuromuscular re-education, Balance training, Gait training, Patient/Family education, Self Care, Joint mobilization, Joint manipulation, Dry Needling, Electrical stimulation, Spinal mobilization, Taping, Traction, Ultrasound, Manual therapy, and Re-evaluation  PLAN FOR NEXT SESSION: review and progress HEP, manual therapy and TrDN   Jena Gauss, PT, DPT 10/02/2023, 4:21 PM

## 2023-10-09 ENCOUNTER — Ambulatory Visit: Payer: BC Managed Care – PPO | Admitting: Physical Therapy

## 2023-10-16 ENCOUNTER — Ambulatory Visit: Payer: BC Managed Care – PPO | Admitting: Physical Therapy

## 2023-10-23 ENCOUNTER — Encounter: Payer: Self-pay | Admitting: Physical Therapy

## 2023-10-23 ENCOUNTER — Ambulatory Visit: Payer: BC Managed Care – PPO | Attending: Internal Medicine | Admitting: Physical Therapy

## 2023-10-23 DIAGNOSIS — M25511 Pain in right shoulder: Secondary | ICD-10-CM | POA: Diagnosis present

## 2023-10-23 DIAGNOSIS — M6281 Muscle weakness (generalized): Secondary | ICD-10-CM | POA: Diagnosis present

## 2023-10-23 DIAGNOSIS — R252 Cramp and spasm: Secondary | ICD-10-CM | POA: Diagnosis present

## 2023-10-23 DIAGNOSIS — M542 Cervicalgia: Secondary | ICD-10-CM | POA: Insufficient documentation

## 2023-10-23 NOTE — Therapy (Addendum)
 OUTPATIENT PHYSICAL THERAPY TREATMENT/ Discharge Summary  Progress Note/Recert Reporting Period 07/31/23 to 10/23/23  See note below for Objective Data and Assessment of Progress/Goals.      Patient Name: Zachary Morales MRN: [id number] DOB:November 17, 1986, 37 y.o., male Today's Date: 10/23/2023  END OF SESSION:  PT End of Session - 10/23/23 1615     Visit Number 7    Date for PT Re-Evaluation 12/04/23    Authorization Type [employer]    PT Start Time 1535    PT Stop Time 1620    PT Time Calculation (min) 45 min    Activity Tolerance Patient tolerated treatment well    Behavior During Therapy WFL for tasks assessed/performed              Past Medical History:  Diagnosis Date   Anxiety    Asthma    Depression    Seizures (HCC)    Past Surgical History:  Procedure Laterality Date   APPENDECTOMY     Patient Active Problem List   Diagnosis Date Noted   Seizure disorder (HCC) 05/02/2013    PCP: Jackie Plum, MD   REFERRING PROVIDER: Jackie Plum, MD  REFERRING DIAG: M25.511 (ICD-10-CM) - Pain in right shoulder   THERAPY DIAG:  Acute pain of right shoulder  Muscle weakness (generalized)  Cervicalgia  Cramp and spasm  Rationale for Evaluation and Treatment: Rehabilitation  ONSET DATE: 1 month ago  SUBJECTIVE:                                                                                                                                                                                      SUBJECTIVE STATEMENT: Slept funny, neck is bothering him today.  Working on shoulder exercises, but still limited with lifting.  Needs new note for work.  Hand dominance: Right  PERTINENT HISTORY: Anxiety, depression, seizure disorder, smoker.   PAIN:  Are you having pain? Yes: NPRS scale: 6-7/10 Pain location: R upper shoulder  Pain description: achey, sharp, burn Aggravating factors: lifting increases pain to 9/10, sleeping on R side makes it worse.   Relieving  factors: rest  PRECAUTIONS: None  RED FLAGS: None   WEIGHT BEARING RESTRICTIONS: No  FALLS:  Has patient fallen in last 6 months? 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 2024  OBJECTIVE:   DIAGNOSTIC FINDINGS:  Per patient X-rays taken with no findings.  PATIENT SURVEYS:  Quick Dash 50%  COGNITION: Overall cognitive status: Within functional limits for tasks assessed     SENSATION: Light touch: Impaired  C5 dermatome on R by 20%  POSTURE: Rounded shoulders  CERVICAL ROM:  AROM eval 10/23/23  Cervical Flexion 35 45 tight at end  Cervical Extension 32 35  Cervical Rotation to Left 50 60  Cervical Rotation to Right 55 65  Left Sidebend 28 37  Right Sidebend 30 35     UPPER EXTREMITY ROM:   Active ROM Right* eval Left eval Right  10/23/23  Shoulder flexion 110 160 130  Shoulder extension     Shoulder abduction 92 160 120  Shoulder adduction     Shoulder internal rotation waist T8 T12  Shoulder external rotation cant T4 T2  (Blank rows = not tested)  UPPER EXTREMITY MMT:  MMT Right* eval Left eval  Shoulder flexion 4+p! 5  Shoulder extension    Shoulder abduction 4+p! 5  Shoulder internal rotation 5 5  Shoulder external rotation 5 5  Elbow flexion 5 5  Elbow extension 5 5  Wrist flexion 5 5  Wrist extension 5 5  Grip strength (lbs) 55, 55, 60  55, 65, 60   (Blank rows = not tested)  SHOULDER SPECIAL TESTS: Impingement tests: Painful arc test: positive    PALPATION:  Tenderness/tightness R cervical paraspinals, upper trap, levator scapulae, supraspintus, pec minor   TODAY'S TREATMENT:                                                                                                                                          DATE:   10/23/2023 Manual Therapy: to decrease muscle spasm and pain and improve mobility STM/TPR to cervical paraspinals, R UT/LS, PA mobs thoracic spine, R supraspinatus, R deltoids,  skilled palpation and monitoring during dry needling. Trigger Point Dry-Needling  Treatment instructions: Expect mild to moderate muscle soreness. S/S of pneumothorax if dry needled over a lung field, and to seek immediate medical attention should they occur. Patient verbalized understanding of these instructions and education. Patient Consent Given: Yes Education handout provided: Previously Provided Muscles treated:  R UT, R L/S, bil C4-6 multifidi, R supraspinatus, R deltoids Electrical stimulation performed: No Parameters: N/A Treatment response/outcome: Twitch Response Elicited and Palpable Increase in Muscle Length Therapeutic Exercise: to improve strength and mobility.  Demo, verbal and tactile cues throughout for technique. UBE x 5 min forward Shoulder Trio - cues for slow  concentric phase  2, 3, hold 2, 3, eccentric return 2, 3 S/L ER x 10 R  S/L shoulder flexion x 10 R S/L horizontal abduction x 10 R  10/02/2023 Manual Therapy: to decrease muscle spasm and pain and improve mobility STM/TPR to cervical paraspinals, R UT/LS, PA mobs thoracic spine, R supraspinatus, scapular mobs,  skilled palpation and monitoring during dry needling. Trigger Point Dry-Needling  Treatment instructions: Expect  mild to moderate muscle soreness. S/S of pneumothorax if dry needled over a lung field, and to seek immediate medical attention should they occur. Patient verbalized understanding of these instructions and education. Patient Consent Given: Yes Education handout provided: Previously Provided Muscles treated:  R UT, R L/S, bil C4-6 multifidi, R supraspinatus, R latissimus, R pectoralis, R subscap Electrical stimulation performed:  No Parameters: N/A Treatment response/outcome: Twitch Response Elicited and Palpable Increase in Muscle Length  09/25/23 Therapeutic Exercise: to improve strength and mobility.  Demo, verbal and tactile cues throughout for technique. S/L open books with bent arm - laying on Left side x 10 Standing open  books for left side - R shoulder too painful to lay on Manual Therapy: to decrease muscle spasm and pain and improve mobility STM/TPR to cervical paraspinals, R UT/LS, PA mobs thoracic spine, R supraspinatus, PA/UPA mobs cervical spine grade 2-3, NAGS into rotations, skilled palpation and monitoring during dry needling. Trigger Point Dry-Needling  Treatment instructions: Expect mild to moderate muscle soreness. S/S of pneumothorax if dry needled over a lung field, and to seek immediate medical attention should they occur. Patient verbalized understanding of these instructions and education. Patient Consent Given: Yes Education handout provided: Previously Provided Muscles treated:  R UT, R L/S, bil C4-6 multifidi, R supraspinatus, R latissimus Electrical stimulation performed: No Parameters: N/A Treatment response/outcome: Twitch Response Elicited and Palpable Increase in Muscle Length   PATIENT EDUCATION: Education details: HEP update Person educated: Patient Education method: Explanation, Demonstration, Verbal cues, and Handouts Education comprehension: verbalized understanding and returned demonstration  HOME EXERCISE PROGRAM: Access Code: [email or web address] URL: [email or web address] Date: 10/23/2023 Prepared by: Harrie Foreman  Exercises - Seated Cervical Retraction  - 1 x daily - 7 x weekly - 1 sets - 10 reps - Gentle Levator Scapulae Stretch  - 1 x daily - 7 x weekly - 1 sets - 3 reps - 10 hold - Sternocleidomastoid Stretch  - 1 x daily - 7 x weekly - 1 sets - 3 reps - 10 hold - Seated Gentle Upper Trapezius Stretch  - 1 x daily - 7 x weekly - 1 sets - 3 reps - 10  hold - Standing Shoulder Abduction AAROM with Dowel  - 2 x daily - 7 x weekly - 1 sets - 10 reps - Seated Shoulder Flexion Extension AAROM with Dowel into Wall  - 2 x daily - 7 x weekly - 1 sets - 10 reps - Seated Shoulder Circles AAROM with Dowel into Wall  - 2 x daily - 7 x weekly - 1 sets - 10 reps - Standing Shoulder External Rotation AAROM with Dowel  - 2 x daily - 7 x weekly - 1 sets - 10 reps - Prone Shoulder Flexion  - 1 x daily - 7 x weekly - 3 sets - 10 reps - Prone Single Arm Shoulder Y  - 1 x daily - 7 x weekly - 3 sets - 10 reps - Prone Single Arm Shoulder Extension  - 1 x daily - 7 x weekly - 3 sets - 10 reps - Prone Shoulder Row  - 1 x daily - 7 x weekly - 3 sets - 10 reps - Sidelying Shoulder ER with Towel and Dumbbell  - 1 x daily - 3-4 x weekly - 1-3 sets - 10 reps - Sidelying Shoulder Flexion 15 Degrees (Mirrored)  - 1 x daily - 3-4 x weekly - 1-3 sets - 10 reps - Sidelying Shoulder Horizontal Abduction  - 1 x  daily - 3-4 x weekly - 1-3 sets - 10 reps  ASSESSMENT:  CLINICAL IMPRESSION: Zachary Morales is making good progress, has almost met cervical ROM goal (still limited with extension but no pain), shoulder pain has improved 70-75%, but still limited tolerance to activities like lifting.  Today progressed exercises starting with s/l shoulder trio without weight, when able to do 3 sets to progress with light weight.  Also given new note to continue lifting restrictions.  Since he has missed multiple visits and is only able to attend 1x/week, he would benefit from continuing skilled therapy 1x/week for additional 6 weeks.    Zachary Morales continues to demonstrate potential for improvement and would benefit from continued skilled therapy to address impairments.      OBJECTIVE IMPAIRMENTS: decreased activity tolerance, decreased ROM, decreased strength, impaired perceived functional ability, increased muscle spasms, impaired sensation, impaired UE functional use, and pain.    ACTIVITY LIMITATIONS: carrying, lifting, sleeping, reach over head, and hygiene/grooming  PARTICIPATION LIMITATIONS: driving and occupation  PERSONAL FACTORS: 1-2 comorbidities: Anxiety, depression, seizure disorder, smoker  are also affecting patient's functional outcome.   REHAB POTENTIAL: Good  CLINICAL DECISION MAKING: Stable/uncomplicated  EVALUATION COMPLEXITY: Low   GOALS: Goals reviewed with patient? Yes  SHORT TERM GOALS: Target date: 08/14/2023   Patient will be independent with initial HEP.  Baseline: given Goal status: MET 08/14/23  LONG TERM GOALS:  extended to 12/04/23  Patient will be independent with advanced/ongoing HEP to improve outcomes and carryover.  Baseline:  Goal status: IN PROGRESS 10/23/23 -met,  HEP updated  2.  Patient will report 75% improvement in Right shoulder pain to improve QOL.  Baseline:  Goal status: MET 10/23/23- 70-75% improvement  3.  Patient to improve Right shoulder AROM to WNL without pain provocation to allow for increased ease of ADLs.  Baseline: see objective Goal status: IN PROGRESS 10/23/23 - see objective, improved  4.  Patient will demonstrate improved functional UE strength as demonstrated by 5/5 R shoulder strength without pain. Baseline: see objective Goal status: IN PROGRESS  5.  Patient will report at least 10 points improvement on QuickDash to demonstrate improved functional ability.  Baseline: 50% Goal status: IN PROGRESS  6.  Patient will be able to lift up to 70lbs without pain to return to work without restriction.    Baseline: lifting >20lbs increases pain.  Goal status: IN PROGRESS   7. Patient will demonstrate at least 45 deg cervical AROM all directions for safety with driving without pain.  Baseline: see objective Goal status: IN PROGRESS 10/23/23- met all directions except extension.   PLAN:  PT FREQUENCY: 1x/week patient preference due to work  PT DURATION: 6 weeks  PLANNED INTERVENTIONS:  Therapeutic exercises, Therapeutic activity, Neuromuscular re-education, Balance training, Gait training, Patient/Family education, Self Care, Joint mobilization, Joint manipulation, Dry Needling, Electrical stimulation, Spinal mobilization, Taping, Traction, Ultrasound, Manual therapy, and Re-evaluation  PLAN FOR NEXT SESSION: review and progress HEP, manual therapy and TrDN   Jena Gauss, PT, DPT 10/23/2023, 4:48 PM    PHYSICAL THERAPY DISCHARGE SUMMARY  Visits from Start of Care: 7  Current functional level related to goals / functional outcomes: See above   Remaining deficits: See above   Education / Equipment: HEP  Plan: Patient goals were not met. Patient is being discharged due to not returning after 10/22/2024.  He had 3 no shows and remaining visits cancelled per attendance policy.     Jena Gauss, PT  01/11/2024  12:56 PM

## 2023-10-30 ENCOUNTER — Ambulatory Visit: Payer: BC Managed Care – PPO | Admitting: Physical Therapy

## 2023-11-20 ENCOUNTER — Ambulatory Visit: Payer: BC Managed Care – PPO | Admitting: Physical Therapy

## 2023-11-27 ENCOUNTER — Ambulatory Visit: Payer: BC Managed Care – PPO | Attending: Internal Medicine | Admitting: Physical Therapy

## 2024-05-21 ENCOUNTER — Other Ambulatory Visit: Payer: Self-pay | Admitting: Family Medicine

## 2024-05-21 ENCOUNTER — Ambulatory Visit
Admission: RE | Admit: 2024-05-21 | Discharge: 2024-05-21 | Disposition: A | Discharge status: Home / Self Care | Source: Ambulatory Visit | Attending: Family Medicine | Admitting: Family Medicine

## 2024-05-21 DIAGNOSIS — R52 Pain, unspecified: Secondary | ICD-10-CM
